# Patient Record
Sex: Female | Born: 1980 | Race: White | Hispanic: No | Marital: Married | Smoking: Current every day smoker
Health system: Southern US, Community
[De-identification: ages and names within clinical notes are randomized; demographics above are authoritative.]

## PROBLEM LIST (undated history)

## (undated) DIAGNOSIS — D649 Anemia, unspecified: Secondary | ICD-10-CM

## (undated) DIAGNOSIS — F32A Depression, unspecified: Secondary | ICD-10-CM

## (undated) DIAGNOSIS — F410 Panic disorder [episodic paroxysmal anxiety] without agoraphobia: Secondary | ICD-10-CM

## (undated) DIAGNOSIS — F319 Bipolar disorder, unspecified: Secondary | ICD-10-CM

## (undated) DIAGNOSIS — F329 Major depressive disorder, single episode, unspecified: Secondary | ICD-10-CM

## (undated) DIAGNOSIS — R569 Unspecified convulsions: Secondary | ICD-10-CM

## (undated) DIAGNOSIS — Z87442 Personal history of urinary calculi: Secondary | ICD-10-CM

## (undated) HISTORY — DX: Anemia, unspecified: D64.9

## (undated) HISTORY — PX: OTHER SURGICAL HISTORY: SHX169

---

## 2013-08-26 ENCOUNTER — Encounter (HOSPITAL_COMMUNITY): Payer: Self-pay | Admitting: Emergency Medicine

## 2013-08-26 ENCOUNTER — Emergency Department (HOSPITAL_COMMUNITY): Payer: MEDICAID

## 2013-08-26 ENCOUNTER — Emergency Department (HOSPITAL_COMMUNITY)
Admission: EM | Admit: 2013-08-26 | Discharge: 2013-08-26 | Disposition: A | Discharge status: Home / Self Care | Payer: MEDICAID | Attending: Emergency Medicine | Admitting: Emergency Medicine

## 2013-08-26 DIAGNOSIS — R112 Nausea with vomiting, unspecified: Secondary | ICD-10-CM | POA: Insufficient documentation

## 2013-08-26 DIAGNOSIS — R55 Syncope and collapse: Secondary | ICD-10-CM

## 2013-08-26 DIAGNOSIS — F1410 Cocaine abuse, uncomplicated: Secondary | ICD-10-CM | POA: Insufficient documentation

## 2013-08-26 DIAGNOSIS — F1910 Other psychoactive substance abuse, uncomplicated: Secondary | ICD-10-CM

## 2013-08-26 DIAGNOSIS — R0602 Shortness of breath: Secondary | ICD-10-CM | POA: Insufficient documentation

## 2013-08-26 DIAGNOSIS — F17200 Nicotine dependence, unspecified, uncomplicated: Secondary | ICD-10-CM | POA: Insufficient documentation

## 2013-08-26 DIAGNOSIS — Z79899 Other long term (current) drug therapy: Secondary | ICD-10-CM | POA: Insufficient documentation

## 2013-08-26 DIAGNOSIS — R51 Headache: Secondary | ICD-10-CM | POA: Insufficient documentation

## 2013-08-26 DIAGNOSIS — F419 Anxiety disorder, unspecified: Secondary | ICD-10-CM

## 2013-08-26 DIAGNOSIS — N390 Urinary tract infection, site not specified: Secondary | ICD-10-CM | POA: Insufficient documentation

## 2013-08-26 DIAGNOSIS — F319 Bipolar disorder, unspecified: Secondary | ICD-10-CM | POA: Insufficient documentation

## 2013-08-26 DIAGNOSIS — F4310 Post-traumatic stress disorder, unspecified: Secondary | ICD-10-CM | POA: Insufficient documentation

## 2013-08-26 DIAGNOSIS — R079 Chest pain, unspecified: Secondary | ICD-10-CM | POA: Insufficient documentation

## 2013-08-26 DIAGNOSIS — Z792 Long term (current) use of antibiotics: Secondary | ICD-10-CM | POA: Insufficient documentation

## 2013-08-26 DIAGNOSIS — F410 Panic disorder [episodic paroxysmal anxiety] without agoraphobia: Secondary | ICD-10-CM

## 2013-08-26 DIAGNOSIS — Z3202 Encounter for pregnancy test, result negative: Secondary | ICD-10-CM | POA: Insufficient documentation

## 2013-08-26 HISTORY — DX: Bipolar disorder, unspecified: F31.9

## 2013-08-26 HISTORY — DX: Panic disorder (episodic paroxysmal anxiety): F41.0

## 2013-08-26 HISTORY — DX: Major depressive disorder, single episode, unspecified: F32.9

## 2013-08-26 HISTORY — DX: Depression, unspecified: F32.A

## 2013-08-26 LAB — URINALYSIS, ROUTINE W REFLEX MICROSCOPIC
GLUCOSE, UA: NEGATIVE mg/dL
Hgb urine dipstick: NEGATIVE
Nitrite: NEGATIVE
PH: 6 (ref 5.0–8.0)
Specific Gravity, Urine: >1.03 — ABNORMAL HIGH (ref 1.005–1.030)
Urobilinogen, UA: 0.2 mg/dL (ref 0.0–1.0)

## 2013-08-26 LAB — CBC WITH DIFFERENTIAL/PLATELET
Basophils Absolute: 0 10*3/uL (ref 0.0–0.1)
Basophils Relative: 0 % (ref 0–1)
EOS ABS: 0 10*3/uL (ref 0.0–0.7)
Eosinophils Relative: 1 % (ref 0–5)
HCT: 38.8 % (ref 36.0–46.0)
HEMOGLOBIN: 13.5 g/dL (ref 12.0–15.0)
LYMPHS ABS: 2.1 10*3/uL (ref 0.7–4.0)
LYMPHS PCT: 34 % (ref 12–46)
MCH: 31.8 pg (ref 26.0–34.0)
MCHC: 34.8 g/dL (ref 30.0–36.0)
MCV: 91.5 fL (ref 78.0–100.0)
Monocytes Absolute: 0.5 10*3/uL (ref 0.1–1.0)
Monocytes Relative: 8 % (ref 3–12)
Neutro Abs: 3.5 10*3/uL (ref 1.7–7.7)
Neutrophils Relative %: 56 % (ref 43–77)
Platelets: 240 10*3/uL (ref 150–400)
RBC: 4.24 MIL/uL (ref 3.87–5.11)
RDW: 12.8 % (ref 11.5–15.5)
WBC: 6.2 10*3/uL (ref 4.0–10.5)

## 2013-08-26 LAB — RAPID URINE DRUG SCREEN, HOSP PERFORMED
Amphetamines: NOT DETECTED
Barbiturates: NOT DETECTED
Benzodiazepines: POSITIVE — AB
Cocaine: POSITIVE — AB
Opiates: NOT DETECTED
Tetrahydrocannabinol: NOT DETECTED

## 2013-08-26 LAB — BASIC METABOLIC PANEL
BUN: 8 mg/dL (ref 6–23)
CALCIUM: 9.3 mg/dL (ref 8.4–10.5)
CO2: 25 meq/L (ref 19–32)
Chloride: 102 mEq/L (ref 96–112)
Creatinine, Ser: 0.77 mg/dL (ref 0.50–1.10)
GFR calc Af Amer: >90 mL/min (ref >90)
GLUCOSE: 95 mg/dL (ref 70–99)
Potassium: 3.4 mEq/L — ABNORMAL LOW (ref 3.7–5.3)
Sodium: 140 mEq/L (ref 137–147)

## 2013-08-26 LAB — URINE MICROSCOPIC-ADD ON

## 2013-08-26 LAB — PREGNANCY, URINE: Preg Test, Ur: NEGATIVE

## 2013-08-26 MED ORDER — CIPROFLOXACIN HCL 250 MG PO TABS
500.0000 mg | ORAL_TABLET | Freq: Once | ORAL | Status: AC
  Administered 2013-08-26: 500 mg via ORAL
  Filled 2013-08-26: qty 2

## 2013-08-26 MED ORDER — SODIUM CHLORIDE 0.9 % IV SOLN: INTRAVENOUS | Status: DC

## 2013-08-26 MED ORDER — SODIUM CHLORIDE 0.9 % IV BOLUS (SEPSIS)
1000.0000 mL | Freq: Once | INTRAVENOUS | Status: AC
  Administered 2013-08-26: 1000 mL via INTRAVENOUS

## 2013-08-26 MED ORDER — CIPROFLOXACIN HCL 500 MG PO TABS: 500.0000 mg | ORAL_TABLET | Freq: Two times a day (BID) | ORAL | Status: DC

## 2013-08-26 NOTE — Discharge Instructions (Signed)
Taking a bike as directed for the urinary tract infection. Return for any newer worse symptoms. Rest of the workup here today was negative. Most likely had a panic attack with syncopal episode earlier. As she stated that no suicidal thoughts. Return for development of any suicidal thoughts.

## 2013-08-26 NOTE — ED Notes (Signed)
Patient asking about when she will get her regular medications.  Told her I would check w/MD.

## 2013-08-26 NOTE — ED Notes (Signed)
MD notified of patient's request for regular meds.  Also notified she has not yet given urine sample.

## 2013-08-26 NOTE — ED Notes (Signed)
Patient with no complaints at this time. Respirations even and unlabored. Skin warm/dry. Discharge instructions reviewed with patient at this time. Patient given opportunity to voice concerns/ask questions. IV removed per policy and band-aid applied to site. Patient discharged at this time and left Emergency Department with steady gait.  

## 2013-08-26 NOTE — ED Provider Notes (Signed)
CSN: [id number]     Arrival date & time 08/26/13  1653 History  This chart was scribed for Shelda Jakes, MD by Leone Payor, ED Scribe. This patient was seen in room APA17/[facility]17 and the patient's care was started 5:59 PM.    Chief Complaint  Patient presents with  . Anxiety  . Depression    The history is provided by the patient. No language interpreter was used.    HPI Comments: Kelli Mullins is a 33 y.o. female who presents to the Emergency Department complaining of a syncopal episode and chest pain that occurred a few hours ago. Pt was at [facility] when this happened because her children were taken away from her. She told people at [facility] that she would hurt herself if her children were taken from her. She believes she had a panic attack at that time. She currently denies SI and states she is just upset. She has a history of bipolar disease and PTSD. She reports taking Paxil and Cymbalta for this. LNMP was 3 months ago because she receives the Depo injection.   [facility]  Past Medical History  Diagnosis Date  . Bipolar 1 disorder   . Depression   . Panic attacks    History reviewed. No pertinent past surgical history. History reviewed. No pertinent family history. History  Substance Use Topics  . Smoking status: Current Every Day Smoker -- 1.00 packs/day    Types: Cigarettes  . Smokeless tobacco: Not on file  . Alcohol Use: No   OB History   Grav Para Term Preterm Abortions TAB SAB Ect Mult Living                 Review of Systems  Constitutional: Negative for fever and chills.  HENT: Negative for rhinorrhea and sore throat.   Eyes: Negative for visual disturbance.  Respiratory: Positive for shortness of breath. Negative for cough.   Cardiovascular: Positive for chest pain. Negative for leg swelling.  Gastrointestinal: Positive for nausea and vomiting. Negative for diarrhea.  Genitourinary: Negative for dysuria.  Musculoskeletal: Negative for  back pain and neck pain.  Skin: Negative for rash.  Neurological: Positive for syncope and headaches.  Hematological: Does not bruise/bleed easily.  Psychiatric/Behavioral: Negative for confusion.    Allergies  Review of patient's allergies indicates no known allergies.  Home Medications   Current Outpatient Rx  Name  Route  Sig  Dispense  Refill  . Diazepam (VALIUM PO)   Oral   Take 1 tablet by mouth 3 (three) times daily.         . DULoxetine HCl (CYMBALTA PO)   Oral   Take 1 capsule by mouth 2 (two) times daily.         Marland Kitchen PARoxetine HCl (PAXIL PO)   Oral   Take 1 tablet by mouth daily.         . ciprofloxacin (CIPRO) 500 MG tablet   Oral   Take 1 tablet (500 mg total) by mouth 2 (two) times daily.   14 tablet   0    BP 128/96  Pulse 88  Temp(Src) 98.1 F (36.7 C) (Oral)  Resp 18  Ht 5\' 6"  (1.676 m)  Wt 198 lb (89.812 kg)  BMI 31.97 kg/m2  SpO2 99%  LMP 06/22/2013 Physical Exam  Nursing note and vitals reviewed. Constitutional: She is oriented to person, place, and time. She appears well-developed and well-nourished.  HENT:  Head: Normocephalic and atraumatic.  Cardiovascular: Normal  rate, regular rhythm and normal heart sounds.   Pulmonary/Chest: Effort normal and breath sounds normal. No respiratory distress. She has no wheezes. She has no rales. She exhibits no tenderness.  Abdominal: Soft. Bowel sounds are normal. She exhibits no distension. There is no tenderness.  Neurological: She is alert and oriented to person, place, and time.  Skin: Skin is warm and dry.  Psychiatric: She has a normal mood and affect.    ED Course  Procedures (including critical care time)  DIAGNOSTIC STUDIES: Oxygen Saturation is 99% on RA, normal by my interpretation.    COORDINATION OF CARE: 6:05 PM Discussed treatment plan with pt at bedside and pt agreed to plan.   Labs Review Labs Reviewed  URINE RAPID DRUG SCREEN (HOSP PERFORMED) - Abnormal; Notable for the  following:    Cocaine POSITIVE (*)    Benzodiazepines POSITIVE (*)    All other components within normal limits  BASIC METABOLIC PANEL - Abnormal; Notable for the following:    Potassium 3.4 (*)    All other components within normal limits  URINALYSIS, ROUTINE W REFLEX MICROSCOPIC - Abnormal; Notable for the following:    APPearance CLOUDY (*)    Specific Gravity, Urine >1.030 (*)    Bilirubin Urine SMALL (*)    Ketones, ur TRACE (*)    Protein, ur TRACE (*)    Leukocytes, UA TRACE (*)    All other components within normal limits  URINE MICROSCOPIC-ADD ON - Abnormal; Notable for the following:    Bacteria, UA FEW (*)    All other components within normal limits  URINE CULTURE  CBC WITH DIFFERENTIAL  PREGNANCY, URINE   Results for orders placed during the hospital encounter of 08/26/13  URINE RAPID DRUG SCREEN (HOSP PERFORMED)      Result Value Range   Opiates NONE DETECTED  NONE DETECTED   Cocaine POSITIVE (*) NONE DETECTED   Benzodiazepines POSITIVE (*) NONE DETECTED   Amphetamines NONE DETECTED  NONE DETECTED   Tetrahydrocannabinol NONE DETECTED  NONE DETECTED   Barbiturates NONE DETECTED  NONE DETECTED  CBC WITH DIFFERENTIAL      Result Value Range   WBC 6.2  4.0 - 10.5 K/uL   RBC 4.24  3.87 - 5.11 MIL/uL   Hemoglobin 13.5  12.0 - 15.0 g/dL   HCT 16.1  09.6 - 04.5 %   MCV 91.5  78.0 - 100.0 fL   MCH 31.8  26.0 - 34.0 pg   MCHC 34.8  30.0 - 36.0 g/dL   RDW 40.9  81.1 - 91.4 %   Platelets 240  150 - 400 K/uL   Neutrophils Relative % 56  43 - 77 %   Neutro Abs 3.5  1.7 - 7.7 K/uL   Lymphocytes Relative 34  12 - 46 %   Lymphs Abs 2.1  0.7 - 4.0 K/uL   Monocytes Relative 8  3 - 12 %   Monocytes Absolute 0.5  0.1 - 1.0 K/uL   Eosinophils Relative 1  0 - 5 %   Eosinophils Absolute 0.0  0.0 - 0.7 K/uL   Basophils Relative 0  0 - 1 %   Basophils Absolute 0.0  0.0 - 0.1 K/uL  BASIC METABOLIC PANEL      Result Value Range   Sodium 140  137 - 147 mEq/L   Potassium 3.4  (*) 3.7 - 5.3 mEq/L   Chloride 102  96 - 112 mEq/L   CO2 25  19 - 32 mEq/L  Glucose, Bld 95  70 - 99 mg/dL   BUN 8  6 - 23 mg/dL   Creatinine, Ser 1.61  0.50 - 1.10 mg/dL   Calcium 9.3  8.4 - 09.6 mg/dL   GFR calc non Af Amer >90  >90 mL/min   GFR calc Af Amer >90  >90 mL/min  PREGNANCY, URINE      Result Value Range   Preg Test, Ur NEGATIVE  NEGATIVE  URINALYSIS, ROUTINE W REFLEX MICROSCOPIC      Result Value Range   Color, Urine YELLOW  YELLOW   APPearance CLOUDY (*) CLEAR   Specific Gravity, Urine >1.030 (*) 1.005 - 1.030   pH 6.0  5.0 - 8.0   Glucose, UA NEGATIVE  NEGATIVE mg/dL   Hgb urine dipstick NEGATIVE  NEGATIVE   Bilirubin Urine SMALL (*) NEGATIVE   Ketones, ur TRACE (*) NEGATIVE mg/dL   Protein, ur TRACE (*) NEGATIVE mg/dL   Urobilinogen, UA 0.2  0.0 - 1.0 mg/dL   Nitrite NEGATIVE  NEGATIVE   Leukocytes, UA TRACE (*) NEGATIVE  URINE MICROSCOPIC-ADD ON      Result Value Range   WBC, UA 21-50  <3 WBC/hpf   Bacteria, UA FEW (*) RARE   Urine-Other MUCOUS PRESENT      Imaging Review Dg Chest 2 View  08/26/2013   CLINICAL DATA:  Weakness and lethargy.  EXAM: CHEST  2 VIEW  COMPARISON:  None.  FINDINGS: Normal sized heart. Clear lungs. Bilateral nipple shadows. Minimal thoracic spine degenerative changes.  IMPRESSION: No acute abnormality.   Electronically Signed   By: Gordan Payment M.D.   On: 08/26/2013 21:52   Ct Head Wo Contrast  08/26/2013   CLINICAL DATA:  Anxiety, headache, depression.  EXAM: CT HEAD WITHOUT CONTRAST  TECHNIQUE: Contiguous axial images were obtained from the base of the skull through the vertex without intravenous contrast.  COMPARISON:  None.  FINDINGS: No acute intracranial abnormality. Specifically, no hemorrhage, hydrocephalus, mass lesion, acute infarction, or significant intracranial injury. No acute calvarial abnormality. Visualized paranasal sinuses and mastoids clear. Orbital soft tissues unremarkable.  IMPRESSION: Negative study.    Electronically Signed   By: Charlett Nose M.D.   On: 08/26/2013 21:39    EKG Interpretation   None      Date: 08/26/2013  Rate: 61  Rhythm: normal sinus rhythm  QRS Axis: normal  Intervals: normal  ST/T Wave abnormalities: normal  Conduction Disutrbances:none  Narrative Interpretation:   Old EKG Reviewed: none available    MDM   1. Syncope   2. Panic attack   3. Anxiety   4. UTI (lower urinary tract infection)   5. Substance abuse    Workup negative with the exception of a urinary tract infection. Patient treated for stenosis Cipro here. Urine drug screen consistent with substance abuse. Events earlier in the day sound as if it was a panic attack with a vasovagal syncopal episode. Workup for that has been negative. EKG is normal chest x-ray negative head CT negative no anemia no leukocytosis. Pregnancy test is negative. Chest partner requested to be notified if she was discharged home. They will be notified.  Patient has repeatedly denied suicidal plans are distinct suicidal thoughts here. Patient denies any feelings of suicide is concerned about being alone but not from a suicidal standpoint. Patient will be discharged home. Patient states that she will return for any development of any suicidal thoughts.  I personally performed the services described in this documentation, which was scribed  in my presence. The recorded information has been reviewed and is accurate.     Shelda JakesScott W. Remigio Mcmillon, MD 08/26/13 2203

## 2013-08-26 NOTE — ED Notes (Signed)
Per EMS, called out from [facility]. Pt reports panic attack that started today. Pt alert and oriented. Pt reports " i dont want to go home since my kids won't be home." Pt denies SI/HI at this time but reports " i dont want to go home because I don't know what I might do to myself."

## 2013-08-28 LAB — URINE CULTURE
COLONY COUNT: NO GROWTH
Culture: NO GROWTH

## 2014-09-18 ENCOUNTER — Telehealth: Payer: Self-pay | Admitting: *Deleted

## 2014-09-18 NOTE — Telephone Encounter (Signed)
Pt states had 2 periods this month, now c/o heavy vaginal bleeding, dizziness. Pt states she has a HX of anemia. Pt advised to go to ER for evaluation.  Pt verbalized understanding.

## 2015-05-25 IMAGING — CT CT HEAD W/O CM
1 series · 16 of 30 positions shown, 20 images · non-contrast
Comparison: None.

CLINICAL DATA: Anxiety, headache, depression.

EXAM:
CT HEAD WITHOUT CONTRAST
TECHNIQUE: Contiguous axial images were obtained from the base of the skull
through the vertex without intravenous contrast.

[Series 2: headseq 4.8 h37s · axial · 0.43mm/px · z∈[+962,+1120]mm · 16 of 36 slices shown, 20 images]
[im 2/36  brain]
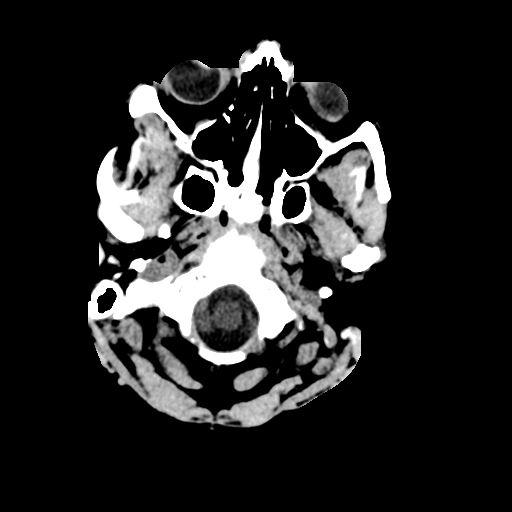
[im 2/36  bone]
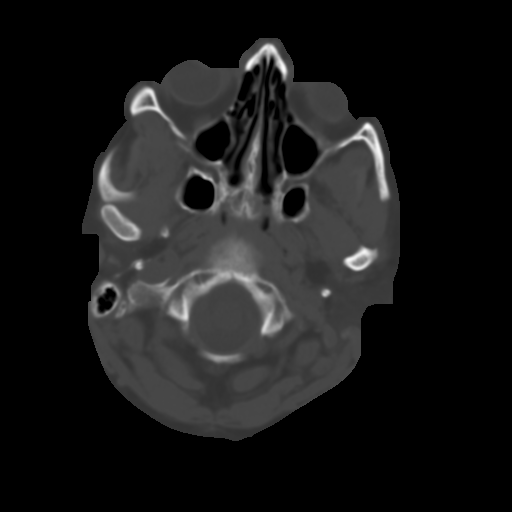
[im 4/36  brain]
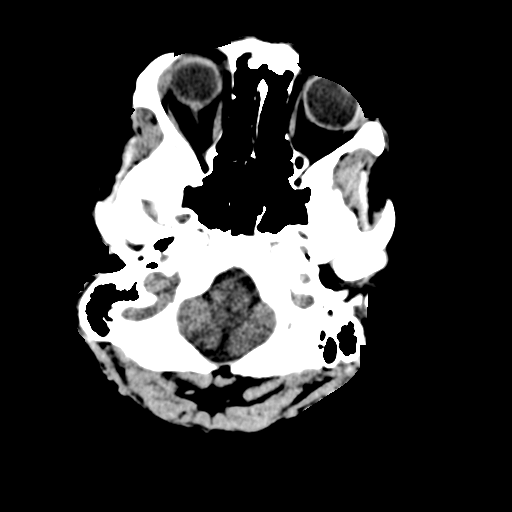
[im 7/36  brain]
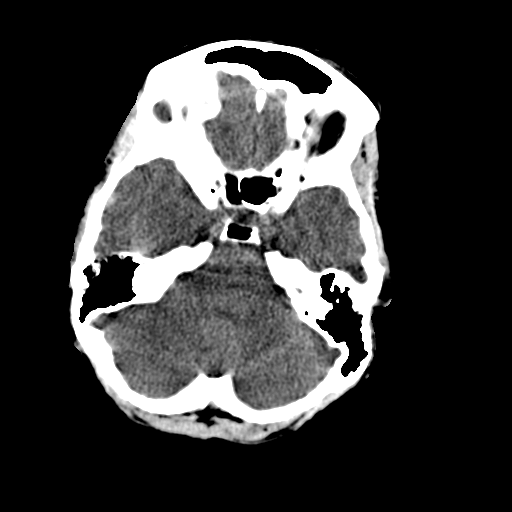
[im 9/36  brain]
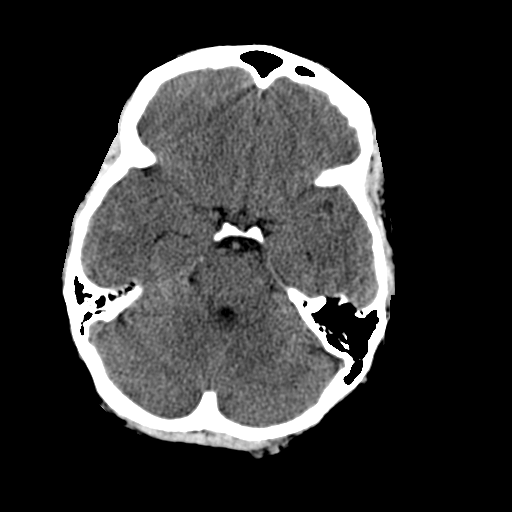
[im 10/36  brain]
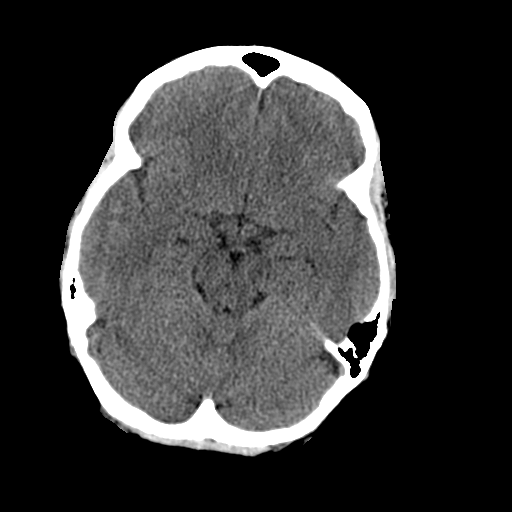
[im 10/36  bone]
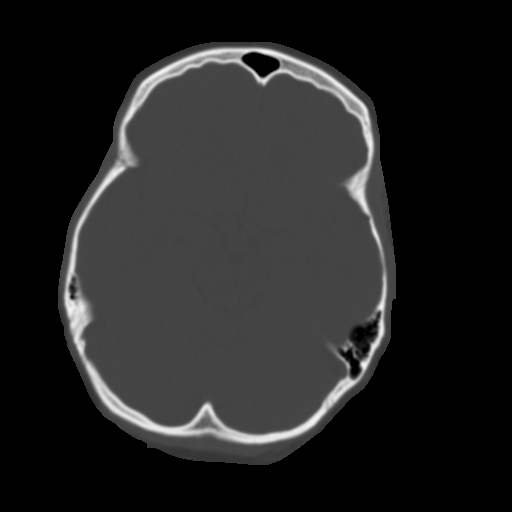
[im 13/36  brain]
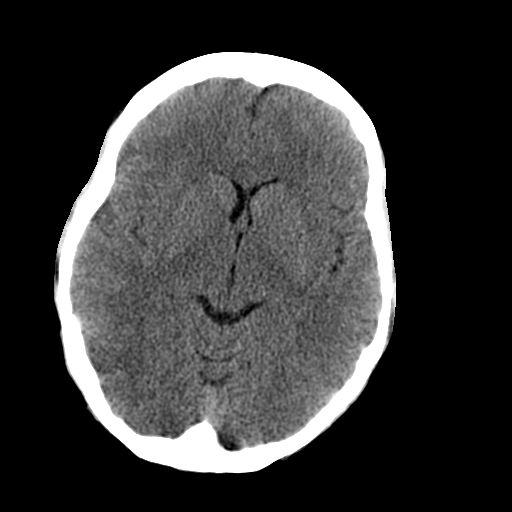
[im 15/36  brain]
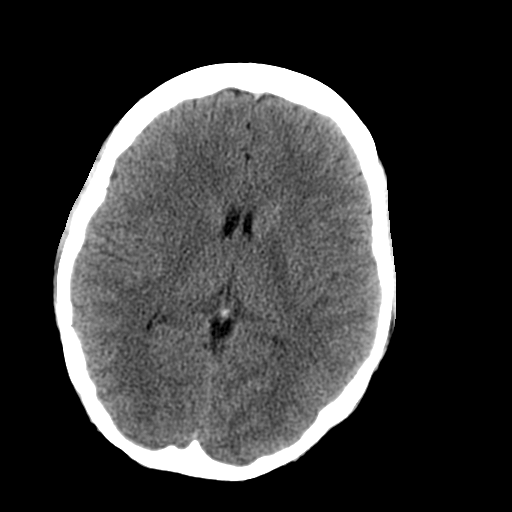
[im 17/36  brain]
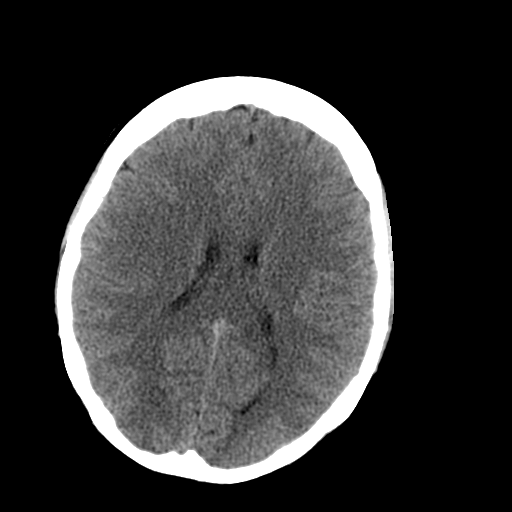
[im 19/36  brain]
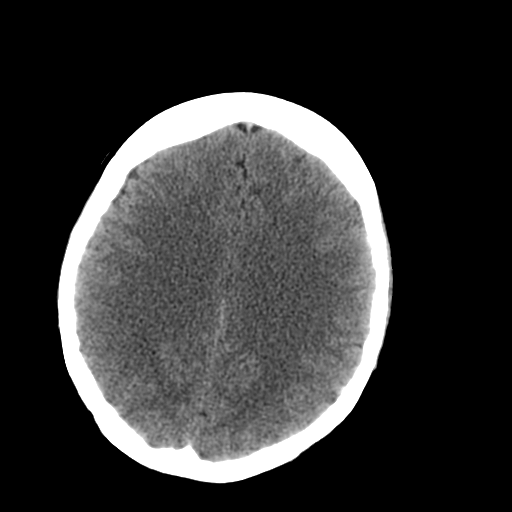
[im 19/36  bone]
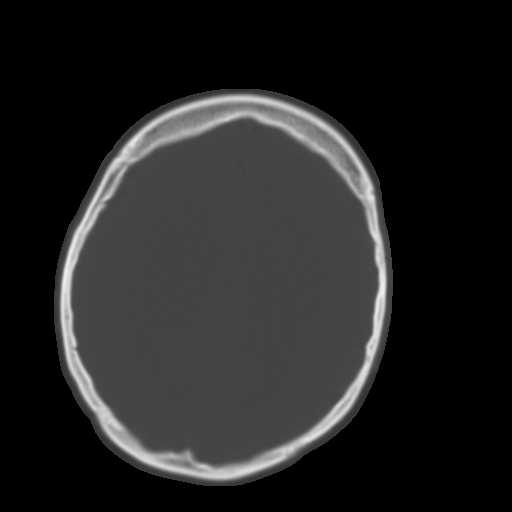
[im 21/36  brain]
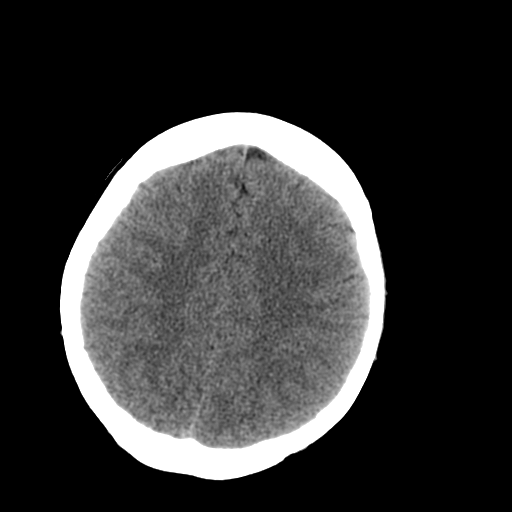
[im 23/36  brain]
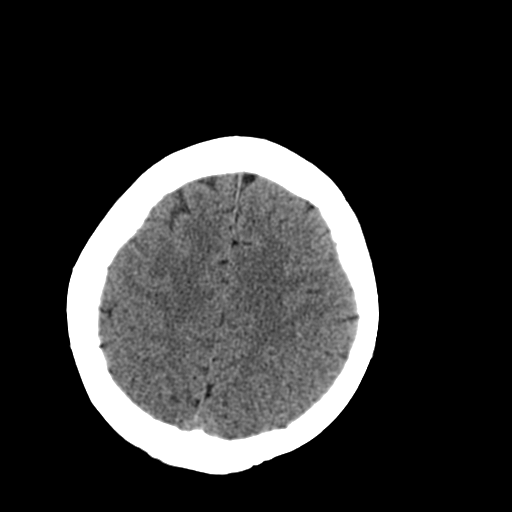
[im 26/36  brain]
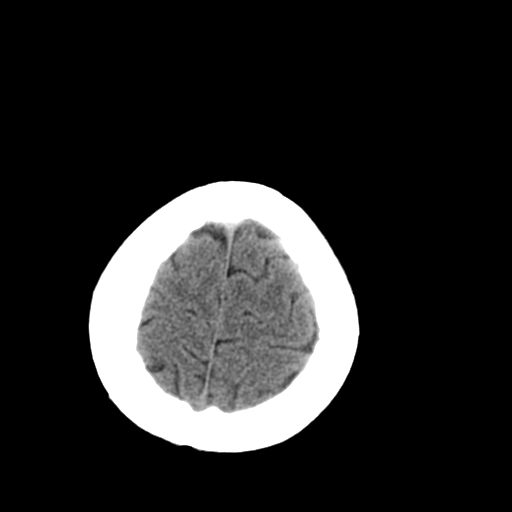
[im 27/36  brain]
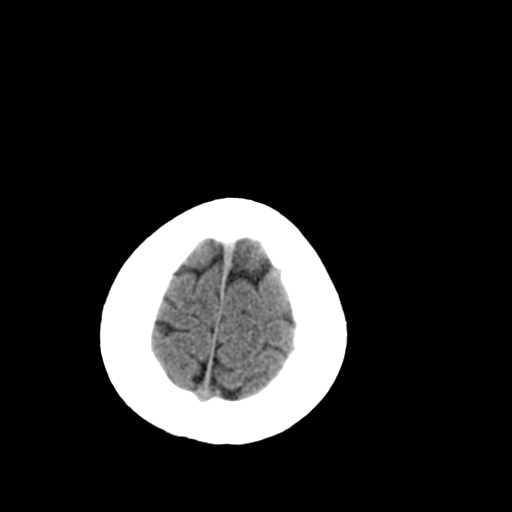
[im 27/36  bone]
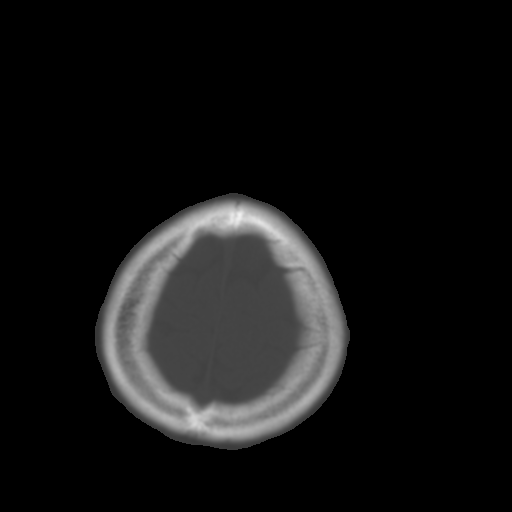
[im 29/36  brain]
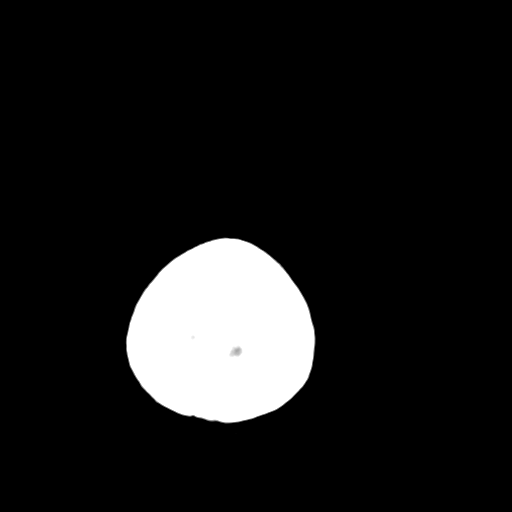
[im 32/36  brain]
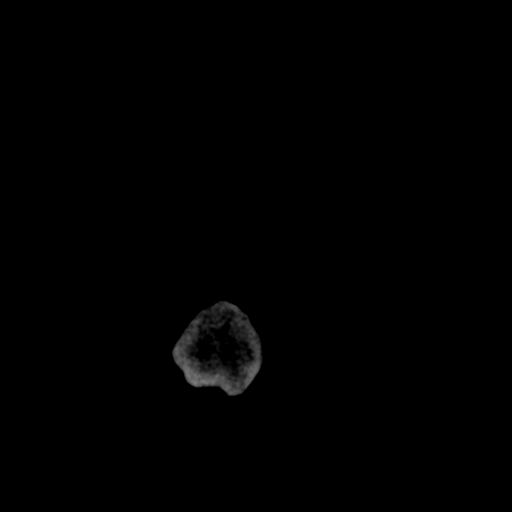
[im 34/36  brain]
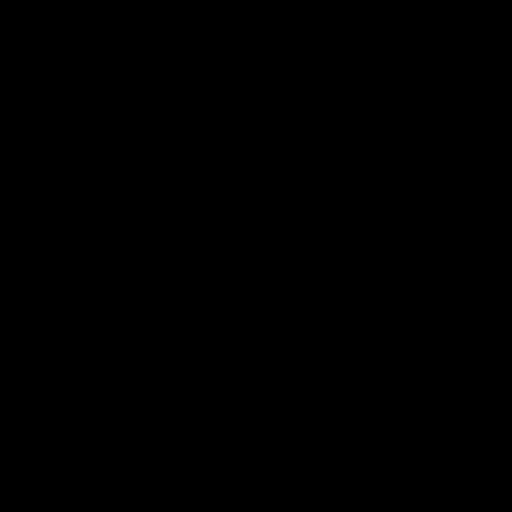

[16 of 30 positions shown; findings below may reference images not displayed]

FINDINGS: No acute intracranial abnormality. Specifically, no hemorrhage,
hydrocephalus, mass lesion, acute infarction, or significant
intracranial injury. No acute calvarial abnormality. Visualized
paranasal sinuses and mastoids clear. Orbital soft tissues
unremarkable.
IMPRESSION: Negative study.

## 2016-06-25 ENCOUNTER — Emergency Department (HOSPITAL_COMMUNITY)
Admission: EM | Admit: 2016-06-25 | Discharge: 2016-06-25 | Disposition: A | Discharge status: Home / Self Care | Payer: Medicaid Other | Attending: Emergency Medicine | Admitting: Emergency Medicine

## 2016-06-25 ENCOUNTER — Encounter (HOSPITAL_COMMUNITY): Payer: Self-pay | Admitting: Emergency Medicine

## 2016-06-25 DIAGNOSIS — F17210 Nicotine dependence, cigarettes, uncomplicated: Secondary | ICD-10-CM | POA: Insufficient documentation

## 2016-06-25 DIAGNOSIS — N760 Acute vaginitis: Secondary | ICD-10-CM | POA: Insufficient documentation

## 2016-06-25 DIAGNOSIS — B9689 Other specified bacterial agents as the cause of diseases classified elsewhere: Secondary | ICD-10-CM

## 2016-06-25 DIAGNOSIS — Z202 Contact with and (suspected) exposure to infections with a predominantly sexual mode of transmission: Secondary | ICD-10-CM | POA: Insufficient documentation

## 2016-06-25 LAB — WET PREP, GENITAL
Sperm: NONE SEEN
TRICH WET PREP: NONE SEEN
Yeast Wet Prep HPF POC: NONE SEEN

## 2016-06-25 LAB — RAPID HIV SCREEN (HIV 1/2 AB+AG)
HIV 1/2 Antibodies: NONREACTIVE
HIV-1 P24 Antigen - HIV24: NONREACTIVE

## 2016-06-25 MED ORDER — PENICILLIN G BENZATHINE 1200000 UNIT/2ML IM SUSP
1.2000 10*6.[IU] | Freq: Once | INTRAMUSCULAR | Status: AC
  Administered 2016-06-25: 1.2 10*6.[IU] via INTRAMUSCULAR
  Filled 2016-06-25: qty 2

## 2016-06-25 MED ORDER — METRONIDAZOLE 500 MG PO TABS: 500.0000 mg | ORAL_TABLET | Freq: Two times a day (BID) | ORAL | 0 refills | Status: DC

## 2016-06-25 NOTE — ED Triage Notes (Signed)
Pt is here because she was exposed to STD from husband.  Denies any symptoms.

## 2016-06-25 NOTE — ED Notes (Signed)
Pt states here because of STD exposure, pt denies any pain but has had some mucus discharge.

## 2016-06-25 NOTE — ED Notes (Signed)
Pt alert & oriented x4, stable gait. Patient given discharge instructions, paperwork & prescription(s). Patient  instructed to stop at the registration desk to finish any additional paperwork. Patient verbalized understanding. Pt left department w/ no further questions. 

## 2016-06-25 NOTE — Discharge Instructions (Signed)
As discussed,  we recommend 2 more doses of the antibiotic for 2 additional weeks as it is unclear how long ago you were exposed to syphilis.  You may see your doctor or go to the health department for your next doses.  Return here if you are unable to get your treatments at either of the those sources.

## 2016-06-26 LAB — HIV ANTIBODY (ROUTINE TESTING W REFLEX): HIV Screen 4th Generation wRfx: NONREACTIVE

## 2016-06-27 LAB — GC/CHLAMYDIA PROBE AMP (~~LOC~~) NOT AT ARMC
Chlamydia: NEGATIVE
NEISSERIA GONORRHEA: NEGATIVE

## 2016-06-27 NOTE — ED Provider Notes (Signed)
[facility]-EMERGENCY DEPT Provider Note   CSN: [id number] Arrival date & time: 06/25/16  1238     History   Chief Complaint Chief Complaint  Patient presents with  . Exposure to STD    HPI Kelli Mullins is a 35 y.o. 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  She presents with her new husband who was just notified by our department that he tested positive for syphilis.  Patient denies any history of an unexplained rash, painless ulcers or genital lesions but does endorse she has noticed increased vaginal discharge for the past month.  She denies fevers, chills, abdominal pain, nausea or vomiting.  They state that they were separated until 4 months ago when they got back together, in fact just got married today.  She is desirous to be tested for all stds in addition to treatment for syphilis.  It is unclear how long her husband may have had this infection.  He states his last partner before her was more than 4 months ago.  The history is provided by the patient and the spouse.    Past Medical History:  Diagnosis Date  . Bipolar 1 disorder (HCC)   . Depression   . Panic attacks     There are no active problems to display for this patient.   No past surgical history on file.  OB History    No data available       Home Medications    Prior to Admission medications   Medication Sig Start Date End Date Taking? Authorizing Provider  Multiple Vitamin (MULTIVITAMIN) tablet Take 1 tablet by mouth daily.   Yes Historical Provider, MD  metroNIDAZOLE (FLAGYL) 500 MG tablet Take 1 tablet (500 mg total) by mouth 2 (two) times daily. 06/25/16   Burgess AmorJulie Adriahna Shearman, PA-C    Family History No family history on file.  Social History Social History  Substance Use Topics  . Smoking status: Current Every Day Smoker    Packs/day: 1.00    Types: Cigarettes  . Smokeless tobacco: Never Used  . Alcohol use No     Allergies   Patient has no known  allergies.   Review of Systems Review of Systems  Constitutional: Negative for chills and fever.  HENT: Negative for congestion and sore throat.   Eyes: Negative.   Respiratory: Negative for chest tightness and shortness of breath.   Cardiovascular: Negative for chest pain.  Gastrointestinal: Negative for abdominal pain and nausea.  Genitourinary: Positive for vaginal discharge. Negative for genital sores.  Musculoskeletal: Negative for arthralgias, joint swelling and neck pain.  Skin: Negative.  Negative for rash and wound.  Neurological: Negative for dizziness, weakness, light-headedness, numbness and headaches.  Psychiatric/Behavioral: Negative.      Physical Exam Updated Vital Signs BP 125/71 (BP Location: Left Arm)   Pulse (!) 55   Temp 97.7 F (36.5 C) (Oral)   Resp 16   Ht 5\' 7"  (1.702 m)   Wt 77.1 kg   LMP 06/18/2016   SpO2 100%   BMI 26.63 kg/m   Physical Exam  Constitutional: She appears well-developed and well-nourished.  HENT:  Head: Normocephalic and atraumatic.  Eyes: Conjunctivae are normal.  Neck: Normal range of motion.  Cardiovascular: Normal rate, regular rhythm, normal heart sounds and intact distal pulses.   Pulmonary/Chest: Effort normal and breath sounds normal. She has no wheezes.  Abdominal: Soft. Bowel sounds are normal. There is no tenderness.  Genitourinary: There is no lesion on the  right labia. There is no lesion on the left labia. Cervix exhibits no motion tenderness, no discharge and no friability. Right adnexum displays no mass, no tenderness and no fullness. Left adnexum displays no mass, no tenderness and no fullness. No erythema or tenderness in the vagina. Vaginal discharge found.  Genitourinary Comments: Thick, clear, mucoid discharge.  Musculoskeletal: Normal range of motion.  Neurological: She is alert.  Skin: Skin is warm and dry.  Psychiatric: She has a normal mood and affect.  Nursing note and vitals reviewed.    ED  Treatments / Results  Labs (all labs ordered are listed, but only abnormal results are displayed) Labs Reviewed  WET PREP, GENITAL - Abnormal; Notable for the following:       Result Value   Clue Cells Wet Prep HPF POC PRESENT (*)    WBC, Wet Prep HPF POC PRESENT (*)    All other components within normal limits  HIV ANTIBODY (ROUTINE TESTING)  RAPID HIV SCREEN (HIV 1/2 AB+AG)  GC/CHLAMYDIA PROBE AMP ([facility]    EKG  EKG Interpretation None       Radiology No results found.  Procedures Procedures (including critical care time)  Medications Ordered in ED Medications  penicillin g benzathine (BICILLIN LA) 1200000 UNIT/2ML injection 1.2 Million Units (1.2 Million Units Intramuscular Given 06/25/16 1437)  penicillin g benzathine (BICILLIN LA) 1200000 UNIT/2ML injection 1.2 Million Units (1.2 Million Units Intramuscular Given 06/25/16 1508)     Initial Impression / Assessment and Plan / ED Course  I have reviewed the triage vital signs and the nursing notes.  Pertinent labs & imaging results that were available during my care of the patient were reviewed by me and considered in my medical decision making (see chart for details).  Clinical Course     Pt tx with bicillin 2.4 mil units.  Prescribed flagyl for bv.  She was advised 2 more doses weekly since the exposure timeline is unclear.  Cx pending, HIV and RPR collected.  Pcp or health dept for further tx returning here if unable to get treated by these other sources.  Final Clinical Impressions(s) / ED Diagnoses   Final diagnoses:  Exposure to syphilis  Bacterial vaginosis    New Prescriptions Discharge Medication List as of 06/25/2016  3:40 PM       Burgess AmorJulie Abdulkarim Eberlin, PA-C 06/27/16 1456    Marily MemosJason Mesner, MD 07/02/16 [phone]

## 2016-12-11 ENCOUNTER — Emergency Department (HOSPITAL_COMMUNITY)
Admission: EM | Admit: 2016-12-11 | Discharge: 2016-12-11 | Disposition: A | Discharge status: Home / Self Care | Payer: Self-pay | Attending: Emergency Medicine | Admitting: Emergency Medicine

## 2016-12-11 ENCOUNTER — Encounter (HOSPITAL_COMMUNITY): Payer: Self-pay

## 2016-12-11 DIAGNOSIS — L03114 Cellulitis of left upper limb: Secondary | ICD-10-CM | POA: Insufficient documentation

## 2016-12-11 DIAGNOSIS — F17210 Nicotine dependence, cigarettes, uncomplicated: Secondary | ICD-10-CM | POA: Insufficient documentation

## 2016-12-11 MED ORDER — DOXYCYCLINE HYCLATE 100 MG PO CAPS: 100.0000 mg | ORAL_CAPSULE | Freq: Two times a day (BID) | ORAL | 0 refills | Status: AC

## 2016-12-11 MED ORDER — DOXYCYCLINE HYCLATE 100 MG PO TABS
100.0000 mg | ORAL_TABLET | Freq: Once | ORAL | Status: AC
  Administered 2016-12-11: 100 mg via ORAL
  Filled 2016-12-11: qty 1

## 2016-12-11 MED ORDER — IBUPROFEN 600 MG PO TABS: 600.0000 mg | ORAL_TABLET | Freq: Four times a day (QID) | ORAL | 0 refills | Status: DC | PRN

## 2016-12-11 MED ORDER — IBUPROFEN 400 MG PO TABS
600.0000 mg | ORAL_TABLET | Freq: Once | ORAL | Status: AC
  Administered 2016-12-11: 600 mg via ORAL
  Filled 2016-12-11: qty 1

## 2016-12-11 NOTE — ED Triage Notes (Signed)
Pt has red, cellulitic to left posterior arm/ elbow x 3 days. PT reports clear drainage. No fevers/chills

## 2016-12-11 NOTE — Discharge Instructions (Signed)
Take your antibiotics as prescribed until completed. I also recommend taking ibuprofen and applying ice to affected area for 15-20 minutes 3-4 times daily for additional relief. Return to the emergency department in 2 days for wound recheck. Return to the emergency department sooner if symptoms worsen or new onset of fever, numbness, weakness, decreased range of motion, vomiting, spreading of redness, red streaking, drainage.

## 2016-12-11 NOTE — ED Provider Notes (Signed)
[facility]-EMERGENCY DEPT Provider Note    By signing my name below, I, Earmon Phoenix, attest that this documentation has been prepared under the direction and in the presence of Melburn Hake, PA-C. Electronically Signed: Earmon Phoenix, ED Scribe. 12/11/16. 3:54 PM.    History   Chief Complaint No chief complaint on file.  The history is provided by the patient and medical records. No language interpreter was used.    Kelli Mullins is an obese 36 y.o. female with PMHx of bipolar disorder, depression and panic attacks who presents to the Emergency Department complaining of worsening posterior left elbow redness and swelling that began three days ago. She reports associated severe pain and clear drainage. She states the area began as a small pimple like lesion but has been worsening since. She reports associated tingling of the fingers tips of the left hand. She has not taken anything for pain. Moving the elbow or touching the area increases the pain. She denies alleviating factors. She denies fever, chills, nausea, vomiting, numbness, tingling or weakness of the LUE. She denies any trauma, injury or fall. Denies IVDU. She is left hand dominant. She states she has an appt with her PCP in five days but could not wait. She denies allergies to any medications.    Past Medical History:  Diagnosis Date  . Bipolar 1 disorder (HCC)   . Depression   . Panic attacks     There are no active problems to display for this patient.   History reviewed. No pertinent surgical history.  OB History    No data available       Home Medications    Prior to Admission medications   Medication Sig Start Date End Date Taking? Authorizing Provider  doxycycline (VIBRAMYCIN) 100 MG capsule Take 1 capsule (100 mg total) by mouth 2 (two) times daily. 12/11/16 12/18/16  Barrett Henle, PA-C  ibuprofen (ADVIL,MOTRIN) 600 MG tablet Take 1 tablet (600 mg total) by mouth every 6 (six) hours as needed.  12/11/16   Barrett Henle, PA-C  metroNIDAZOLE (FLAGYL) 500 MG tablet Take 1 tablet (500 mg total) by mouth 2 (two) times daily. 06/25/16   Burgess Amor, PA-C  Multiple Vitamin (MULTIVITAMIN) tablet Take 1 tablet by mouth daily.    [provider]    Family History No family history on file.  Social History Social History  Substance Use Topics  . Smoking status: Current Every Day Smoker    Packs/day: 1.00    Types: Cigarettes  . Smokeless tobacco: Never Used  . Alcohol use No     Allergies   Patient has no known allergies.   Review of Systems Review of Systems  Constitutional: Negative for chills and fever.  Gastrointestinal: Negative for nausea and vomiting.  Musculoskeletal: Positive for joint swelling.  Skin: Positive for color change.  Neurological: Negative for weakness and numbness.     Physical Exam Updated Vital Signs BP 111/79 (BP Location: Left Arm)   Pulse 94   Temp 97.7 F (36.5 C) (Oral)   Resp 16   Ht 5\' 7"  (1.702 m)   Wt 190 lb (86.2 kg)   LMP 11/14/2016   SpO2 98%   BMI 29.76 kg/m   Physical Exam  Constitutional: She is oriented to person, place, and time. She appears well-developed and well-nourished.  HENT:  Head: Normocephalic and atraumatic.  Eyes: Conjunctivae and EOM are normal. Right eye exhibits no discharge. Left eye exhibits no discharge. No scleral icterus.  Neck: Normal  range of motion. Neck supple.  Cardiovascular: Normal rate and intact distal pulses.   Pulmonary/Chest: Effort normal.  Musculoskeletal: Normal range of motion. She exhibits tenderness. She exhibits no edema or deformity.  Full ROM of left hand, wrist, forearm, elbow and shoulder with 5/5 strength. No joint effusion present to left elbow. No bony tenderness. Radial pulse 2+. Sensation grossly intact. Cap refill less than 2 seconds.  Neurological: She is alert and oriented to person, place, and time.  Skin: Skin is warm and dry. Capillary refill takes  less than 2 seconds. There is erythema.  9 x 10 cm area of erythema, warmth and swelling present to posterior aspect of left proximal forearm. Small, papule present to left posterior elbow without induration, fluctuance or drainage.  Nursing note and vitals reviewed.    ED Treatments / Results  DIAGNOSTIC STUDIES: Oxygen Saturation is 98% on RA, normal by my interpretation.   COORDINATION OF CARE: 3:43 PM- Will mark the area of redness and prescribe antibiotics. Return precautions discussed. Instructed pt to return for recheck in two days if symptoms continue or worsen. Pt verbalizes understanding and agrees to plan.  Medications  doxycycline (VIBRA-TABS) tablet 100 mg (not administered)  ibuprofen (ADVIL,MOTRIN) tablet 600 mg (not administered)    Labs (all labs ordered are listed, but only abnormal results are displayed) Labs Reviewed - No data to display  EKG  EKG Interpretation None       Radiology No results found.  Procedures Procedures (including critical care time)  Medications Ordered in ED Medications  doxycycline (VIBRA-TABS) tablet 100 mg (not administered)  ibuprofen (ADVIL,MOTRIN) tablet 600 mg (not administered)     Initial Impression / Assessment and Plan / ED Course  I have reviewed the triage vital signs and the nursing notes.  Pertinent labs & imaging results that were available during my care of the patient were reviewed by me and considered in my medical decision making (see chart for details).     Patient presentation consistent with cellulitis. Afebrile. No tachycardia, hypotension or other symptoms suggestive of severe infection. Area has been demarcated and pt advised to follow up for wound check in 2-3 days, sooner for worsening systemic symptoms, new lymphangitis, or significant spread of erythema past line of demarcation. Will discharge with Doxycycline. Return precautions discussed. Pt appears safe for discharge.    Final Clinical  Impressions(s) / ED Diagnoses   Final diagnoses:  Cellulitis of left upper extremity    New Prescriptions New Prescriptions   DOXYCYCLINE (VIBRAMYCIN) 100 MG CAPSULE    Take 1 capsule (100 mg total) by mouth 2 (two) times daily.   IBUPROFEN (ADVIL,MOTRIN) 600 MG TABLET    Take 1 tablet (600 mg total) by mouth every 6 (six) hours as needed.    I personally performed the services described in this documentation, which was scribed in my presence. The recorded information has been reviewed and is accurate.    Barrett Henleadeau, Fariha Goto Elizabeth, PA-C 12/11/16 1554    Melene PlanFloyd, Dan, DO 12/11/16 1606

## 2017-07-21 DIAGNOSIS — R569 Unspecified convulsions: Secondary | ICD-10-CM

## 2017-07-21 HISTORY — DX: Unspecified convulsions: R56.9

## 2018-08-19 ENCOUNTER — Ambulatory Visit (INDEPENDENT_AMBULATORY_CARE_PROVIDER_SITE_OTHER): Payer: Self-pay | Admitting: Adult Health

## 2018-08-19 ENCOUNTER — Encounter: Payer: Self-pay | Admitting: Adult Health

## 2018-08-19 VITALS — BP 123/83 | HR 71 | Ht 65.0 in | Wt 190.0 lb

## 2018-08-19 DIAGNOSIS — O219 Vomiting of pregnancy, unspecified: Secondary | ICD-10-CM | POA: Insufficient documentation

## 2018-08-19 DIAGNOSIS — Z3201 Encounter for pregnancy test, result positive: Secondary | ICD-10-CM

## 2018-08-19 DIAGNOSIS — O3680X0 Pregnancy with inconclusive fetal viability, not applicable or unspecified: Secondary | ICD-10-CM | POA: Insufficient documentation

## 2018-08-19 DIAGNOSIS — O09521 Supervision of elderly multigravida, first trimester: Secondary | ICD-10-CM | POA: Insufficient documentation

## 2018-08-19 DIAGNOSIS — Z3A09 9 weeks gestation of pregnancy: Secondary | ICD-10-CM | POA: Insufficient documentation

## 2018-08-19 DIAGNOSIS — R112 Nausea with vomiting, unspecified: Secondary | ICD-10-CM

## 2018-08-19 DIAGNOSIS — F1991 Other psychoactive substance use, unspecified, in remission: Secondary | ICD-10-CM | POA: Insufficient documentation

## 2018-08-19 DIAGNOSIS — N926 Irregular menstruation, unspecified: Secondary | ICD-10-CM

## 2018-08-19 DIAGNOSIS — Z87898 Personal history of other specified conditions: Secondary | ICD-10-CM

## 2018-08-19 LAB — POCT URINE PREGNANCY: Preg Test, Ur: POSITIVE — AB

## 2018-08-19 MED ORDER — PRENATAL PLUS 27-1 MG PO TABS: 1.0000 | ORAL_TABLET | Freq: Every day | ORAL | 12 refills | Status: DC

## 2018-08-19 MED ORDER — PROMETHAZINE HCL 25 MG PO TABS: 25.0000 mg | ORAL_TABLET | Freq: Four times a day (QID) | ORAL | 1 refills | Status: DC | PRN

## 2018-08-19 NOTE — Progress Notes (Signed)
Patient ID: Kelli Mullins, female   DOB: [birth year], 38 y.o.   MRN: [id number] History of Present Illness:  Kelli Mullins is a 38 year old white female, recently married(he is 44), in for UPT. Has missed periods and had 3+HPTs.  PCP is Dr Rayfield Citizen.   Current Medications, Allergies, Past Medical History, Past Surgical History, Family History and Social History were reviewed in [employer] record.     Review of Systems:  Periods irregular, but missed periods and had 3+HPTs +nasuea and vomiting She has used cocaine in last week   Physical Exam:BP 123/83 (BP Location: Left Arm, Patient Position: Sitting, Cuff Size: Normal)   Pulse 71   Ht 5\' 5"  (1.651 m)   Wt 190 lb (86.2 kg)   LMP 06/15/2018   BMI 31.62 kg/m   UPT +, about 9+2 weeks by LMP with EDD 03/23/2019. General:  Well developed, well nourished, no acute distress Skin:  Warm and dry,+tattoos Neck:  Midline trachea, normal thyroid, good ROM, no lymphadenopathy Lungs; Clear to auscultation bilaterally Cardiovascular: Regular rate and rhythm Abdomen:  Soft, non tender Psych:  No mood changes, alert and cooperative,seems happy Fall risk is low PHQ 2 score 0.  Impression:  1. Pregnancy examination or test, positive result   2. [redacted] weeks gestation of pregnancy   3. Nausea and vomiting during pregnancy prior to [redacted] weeks gestation   4. Multigravida of advanced maternal age in first trimester   5. Encounter to determine fetal viability of pregnancy, single or unspecified fetus   6. History of drug use      Plan: No more cocaine Meds ordered this encounter  Medications  . prenatal vitamin w/FE, FA (PRENATAL 1 + 1) 27-1 MG TABS tablet    Sig: Take 1 tablet by mouth daily at 12 noon.    Dispense:  30 each    Refill:  12    Order Specific Question:   Supervising Provider    Answer:   Despina Hidden, LUTHER H [[id number]]  . promethazine (PHENERGAN) 25 MG tablet    Sig: Take 1 tablet (25 mg total) by mouth every 6 (six) hours as  needed for nausea or vomiting.    Dispense:  30 tablet    Refill:  1    Order Specific Question:   Supervising Provider    Answer:   Duane Lope H [[id number]]  Return in 1 week for dating [place] and 2 weeks for New OB Review handouts on First trimester and by [facility]  To apply for pregnancy medicaid

## 2018-08-19 NOTE — Patient Instructions (Signed)
First Trimester of Pregnancy  The first trimester of pregnancy is from week 1 until the end of week 13 (months 1 through 3). A week after a sperm fertilizes an egg, the egg will implant on the wall of the uterus. This embryo will begin to develop into a baby. Genes from you and your partner will form the baby. The female genes will determine whether the baby will be a boy or a girl. At 6-8 weeks, the eyes and face will be formed, and the heartbeat can be seen on ultrasound. At the end of 12 weeks, all the baby's organs will be formed.  Now that you are pregnant, you will want to do everything you can to have a healthy baby. Two of the most important things are to get good prenatal care and to follow your health care provider's instructions. Prenatal care is all the medical care you receive before the baby's birth. This care will help prevent, find, and treat any problems during the pregnancy and childbirth.  Body changes during your first trimester  Your body goes through many changes during pregnancy. The changes vary from woman to woman.   You may gain or lose a couple of pounds at first.   You may feel sick to your stomach (nauseous) and you may throw up (vomit). If the vomiting is uncontrollable, call your health care provider.   You may tire easily.   You may develop headaches that can be relieved by medicines. All medicines should be approved by your health care provider.   You may urinate more often. Painful urination may mean you have a bladder infection.   You may develop heartburn as a result of your pregnancy.   You may develop constipation because certain hormones are causing the muscles that push stool through your intestines to slow down.   You may develop hemorrhoids or swollen veins (varicose veins).   Your breasts may begin to grow larger and become tender. Your nipples may stick out more, and the tissue that surrounds them (areola) may become darker.   Your gums may bleed and may be  sensitive to brushing and flossing.   Dark spots or blotches (chloasma, mask of pregnancy) may develop on your face. This will likely fade after the baby is born.   Your menstrual periods will stop.   You may have a loss of appetite.   You may develop cravings for certain kinds of food.   You may have changes in your emotions from day to day, such as being excited to be pregnant or being concerned that something may go wrong with the pregnancy and baby.   You may have more vivid and strange dreams.   You may have changes in your hair. These can include thickening of your hair, rapid growth, and changes in texture. Some women also have hair loss during or after pregnancy, or hair that feels dry or thin. Your hair will most likely return to normal after your baby is born.  What to expect at prenatal visits  During a routine prenatal visit:   You will be weighed to make sure you and the baby are growing normally.   Your blood pressure will be taken.   Your abdomen will be measured to track your baby's growth.   The fetal heartbeat will be listened to between weeks 10 and 14 of your pregnancy.   Test results from any previous visits will be discussed.  Your health care provider may ask you:     How you are feeling.   If you are feeling the baby move.   If you have had any abnormal symptoms, such as leaking fluid, bleeding, severe headaches, or abdominal cramping.   If you are using any tobacco products, including cigarettes, chewing tobacco, and electronic cigarettes.   If you have any questions.  Other tests that may be performed during your first trimester include:   Blood tests to find your blood type and to check for the presence of any previous infections. The tests will also be used to check for low iron levels (anemia) and protein on red blood cells (Rh antibodies). Depending on your risk factors, or if you previously had diabetes during pregnancy, you may have tests to check for high blood sugar  that affects pregnant women (gestational diabetes).   Urine tests to check for infections, diabetes, or protein in the urine.   An ultrasound to confirm the proper growth and development of the baby.   Fetal screens for spinal cord problems (spina bifida) and Down syndrome.   HIV (human immunodeficiency virus) testing. Routine prenatal testing includes screening for HIV, unless you choose not to have this test.   You may need other tests to make sure you and the baby are doing well.  Follow these instructions at home:  Medicines   Follow your health care provider's instructions regarding medicine use. Specific medicines may be either safe or unsafe to take during pregnancy.   Take a prenatal vitamin that contains at least 600 micrograms (mcg) of folic acid.   If you develop constipation, try taking a stool softener if your health care provider approves.  Eating and drinking     Eat a balanced diet that includes fresh fruits and vegetables, whole grains, good sources of protein such as meat, eggs, or tofu, and low-fat dairy. Your health care provider will help you determine the amount of weight gain that is right for you.   Avoid raw meat and uncooked cheese. These carry germs that can cause birth defects in the baby.   Eating four or five small meals rather than three large meals a day may help relieve nausea and vomiting. If you start to feel nauseous, eating a few soda crackers can be helpful. Drinking liquids between meals, instead of during meals, also seems to help ease nausea and vomiting.   Limit foods that are high in fat and processed sugars, such as fried and sweet foods.   To prevent constipation:  ? Eat foods that are high in fiber, such as fresh fruits and vegetables, whole grains, and beans.  ? Drink enough fluid to keep your urine clear or pale yellow.  Activity   Exercise only as directed by your health care provider. Most women can continue their usual exercise routine during  pregnancy. Try to exercise for 30 minutes at least 5 days a week. Exercising will help you:  ? Control your weight.  ? Stay in shape.  ? Be prepared for labor and delivery.   Experiencing pain or cramping in the lower abdomen or lower back is a good sign that you should stop exercising. Check with your health care provider before continuing with normal exercises.   Try to avoid standing for long periods of time. Move your legs often if you must stand in one place for a long time.   Avoid heavy lifting.   Wear low-heeled shoes and practice good posture.   You may continue to have sex unless your health care   provider tells you not to.  Relieving pain and discomfort   Wear a good support bra to relieve breast tenderness.   Take warm sitz baths to soothe any pain or discomfort caused by hemorrhoids. Use hemorrhoid cream if your health care provider approves.   Rest with your legs elevated if you have leg cramps or low back pain.   If you develop varicose veins in your legs, wear support hose. Elevate your feet for 15 minutes, 3-4 times a day. Limit salt in your diet.  Prenatal care   Schedule your prenatal visits by the twelfth week of pregnancy. They are usually scheduled monthly at first, then more often in the last 2 months before delivery.   Write down your questions. Take them to your prenatal visits.   Keep all your prenatal visits as told by your health care provider. This is important.  Safety   Wear your seat belt at all times when driving.   Make a list of emergency phone numbers, including numbers for family, friends, the hospital, and police and fire departments.  General instructions   Ask your health care provider for a referral to a local prenatal education class. Begin classes no later than the beginning of month 6 of your pregnancy.   Ask for help if you have counseling or nutritional needs during pregnancy. Your health care provider can offer advice or refer you to specialists for help  with various needs.   Do not use hot tubs, steam rooms, or saunas.   Do not douche or use tampons or scented sanitary pads.   Do not cross your legs for long periods of time.   Avoid cat litter boxes and soil used by cats. These carry germs that can cause birth defects in the baby and possibly loss of the fetus by miscarriage or stillbirth.   Avoid all smoking, herbs, alcohol, and medicines not prescribed by your health care provider. Chemicals in these products affect the formation and growth of the baby.   Do not use any products that contain nicotine or tobacco, such as cigarettes and e-cigarettes. If you need help quitting, ask your health care provider. You may receive counseling support and other resources to help you quit.   Schedule a dentist appointment. At home, brush your teeth with a soft toothbrush and be gentle when you floss.  Contact a health care provider if:   You have dizziness.   You have mild pelvic cramps, pelvic pressure, or nagging pain in the abdominal area.   You have persistent nausea, vomiting, or diarrhea.   You have a bad smelling vaginal discharge.   You have pain when you urinate.   You notice increased swelling in your face, hands, legs, or ankles.   You are exposed to fifth disease or chickenpox.   You are exposed to German measles (rubella) and have never had it.  Get help right away if:   You have a fever.   You are leaking fluid from your vagina.   You have spotting or bleeding from your vagina.   You have severe abdominal cramping or pain.   You have rapid weight gain or loss.   You vomit blood or material that looks like coffee grounds.   You develop a severe headache.   You have shortness of breath.   You have any kind of trauma, such as from a fall or a car accident.  Summary   The first trimester of pregnancy is from week 1 until   the end of week 13 (months 1 through 3).   Your body goes through many changes during pregnancy. The changes vary from  woman to woman.   You will have routine prenatal visits. During those visits, your health care provider will examine you, discuss any test results you may have, and talk with you about how you are feeling.  This information is not intended to replace advice given to you by your health care provider. Make sure you discuss any questions you have with your health care provider.  Document Released: 07/01/2001 Document Revised: 06/18/2016 Document Reviewed: 06/18/2016  Elsevier Interactive Patient Education  2019 [employer]

## 2018-08-26 ENCOUNTER — Ambulatory Visit (INDEPENDENT_AMBULATORY_CARE_PROVIDER_SITE_OTHER): Payer: Self-pay

## 2018-08-26 ENCOUNTER — Encounter: Payer: Self-pay | Admitting: Adult Health

## 2018-08-26 ENCOUNTER — Ambulatory Visit (INDEPENDENT_AMBULATORY_CARE_PROVIDER_SITE_OTHER): Payer: Self-pay | Admitting: Adult Health

## 2018-08-26 VITALS — BP 119/76 | HR 72 | Ht 65.0 in | Wt 190.5 lb

## 2018-08-26 DIAGNOSIS — O021 Missed abortion: Secondary | ICD-10-CM

## 2018-08-26 DIAGNOSIS — O3680X0 Pregnancy with inconclusive fetal viability, not applicable or unspecified: Secondary | ICD-10-CM

## 2018-08-26 NOTE — Progress Notes (Signed)
[place] 7+6 wks,single IUP,no FHT,irregular GS 63.6 mm=12+4 wks,normal ovaries bilat,crl 15.67 mm,Jennifer discussed results with PT.

## 2018-08-26 NOTE — Progress Notes (Signed)
Patient ID: Kelli Mullins, female   DOB: March 02, 1981, 38 y.o.   MRN: [id number] History of Present Illness: Kelli Mullins is a 38 year old white female, married in for dating [place] and has GS that measures 12 weeks and fetal pole is 7+6 weeks with no FHM.   Current Medications, Allergies, Past Medical History, Past Surgical History, Family History and Social History were reviewed in [employer] record.     Review of Systems:  Feels the same, no bleeding   Physical Exam:BP 119/76 (BP Location: Left Arm, Patient Position: Sitting, Cuff Size: Normal)   Pulse 72   Ht 5\' 5"  (1.651 m)   Wt 190 lb 8 oz (86.4 kg)   LMP 06/15/2018   BMI 31.70 kg/m  General:  Well developed, well nourished, no acute distress Skin:  Warm and dry Psych:  No mood changes, alert and cooperative,teary  Pt aware of [place] and concerns for miscarriage, will get F/U US and then proceed after that, with treatment options.  Impression: 1. Missed ab       Plan: Return 2/17 for F/U US and see me Call me if starts bleeding

## 2018-09-02 ENCOUNTER — Encounter: Payer: Self-pay | Admitting: Advanced Practice Midwife

## 2018-09-02 ENCOUNTER — Ambulatory Visit: Payer: Self-pay | Admitting: *Deleted

## 2018-09-06 ENCOUNTER — Ambulatory Visit: Payer: Self-pay | Admitting: Adult Health

## 2018-09-06 ENCOUNTER — Ambulatory Visit: Payer: Self-pay

## 2018-09-23 ENCOUNTER — Other Ambulatory Visit: Payer: Self-pay

## 2018-09-23 DIAGNOSIS — O021 Missed abortion: Secondary | ICD-10-CM

## 2018-09-24 ENCOUNTER — Telehealth: Payer: Self-pay | Admitting: *Deleted

## 2018-09-24 LAB — BETA HCG QUANT (REF LAB): hCG Quant: 7904 m[IU]/mL

## 2018-09-24 NOTE — Telephone Encounter (Signed)
LMOVM for patient to return my call

## 2018-09-27 ENCOUNTER — Telehealth: Payer: Self-pay | Admitting: *Deleted

## 2018-09-27 NOTE — Telephone Encounter (Signed)
Patient informed her HCG is rising so she needs a repeat ultrasound.  Verbalized understanding and scheduled.

## 2018-09-28 ENCOUNTER — Encounter: Payer: Self-pay | Admitting: *Deleted

## 2018-09-28 ENCOUNTER — Other Ambulatory Visit: Payer: Self-pay | Admitting: Adult Health

## 2018-10-04 ENCOUNTER — Encounter: Payer: Self-pay | Admitting: Women's Health

## 2018-10-04 ENCOUNTER — Ambulatory Visit: Payer: Self-pay | Admitting: *Deleted

## 2018-10-04 ENCOUNTER — Telehealth: Payer: Self-pay | Admitting: Obstetrics & Gynecology

## 2018-10-04 ENCOUNTER — Telehealth: Payer: Self-pay | Admitting: *Deleted

## 2018-10-04 NOTE — Telephone Encounter (Signed)
Patient states she is confused in regards to her being "pregnant and then not pregnant". Informed patient that at her last U/S, there was not a heartbeat and she could have been to early to detect.  HCG was drawn to see if blood levels were going up in which they are so a repeat U/S was indicated.  Informed the next U/S will give [place] more information. Verbalized understanding.

## 2018-10-04 NOTE — Telephone Encounter (Signed)
Patient called stating that she would like a call back from a nurse, Patient did not state the reason for the call. Please contact pt

## 2018-10-05 ENCOUNTER — Encounter: Payer: Self-pay | Admitting: Obstetrics & Gynecology

## 2018-10-05 ENCOUNTER — Encounter (HOSPITAL_COMMUNITY)
Admission: RE | Admit: 2018-10-05 | Discharge: 2018-10-05 | Disposition: A | Discharge status: Home / Self Care | Payer: Medicaid Other | Source: Ambulatory Visit | Attending: Obstetrics & Gynecology | Admitting: Obstetrics & Gynecology

## 2018-10-05 ENCOUNTER — Ambulatory Visit (INDEPENDENT_AMBULATORY_CARE_PROVIDER_SITE_OTHER): Payer: Medicaid Other | Admitting: Obstetrics & Gynecology

## 2018-10-05 ENCOUNTER — Encounter (HOSPITAL_COMMUNITY): Payer: Self-pay | Admitting: Anesthesiology

## 2018-10-05 ENCOUNTER — Encounter (HOSPITAL_COMMUNITY): Payer: Self-pay

## 2018-10-05 ENCOUNTER — Ambulatory Visit (INDEPENDENT_AMBULATORY_CARE_PROVIDER_SITE_OTHER): Payer: Medicaid Other

## 2018-10-05 ENCOUNTER — Other Ambulatory Visit: Payer: Self-pay

## 2018-10-05 VITALS — BP 109/74 | HR 69 | Ht 65.0 in | Wt 198.0 lb

## 2018-10-05 DIAGNOSIS — O021 Missed abortion: Secondary | ICD-10-CM

## 2018-10-05 DIAGNOSIS — Z01812 Encounter for preprocedural laboratory examination: Secondary | ICD-10-CM | POA: Diagnosis not present

## 2018-10-05 HISTORY — DX: Personal history of urinary calculi: Z87.442

## 2018-10-05 HISTORY — DX: Unspecified convulsions: R56.9

## 2018-10-05 LAB — URINALYSIS, ROUTINE W REFLEX MICROSCOPIC
BILIRUBIN URINE: NEGATIVE
Glucose, UA: NEGATIVE mg/dL
Hgb urine dipstick: NEGATIVE
Ketones, ur: NEGATIVE mg/dL
Nitrite: NEGATIVE
PH: 7 (ref 5.0–8.0)
Protein, ur: NEGATIVE mg/dL
Specific Gravity, Urine: 1.019 (ref 1.005–1.030)

## 2018-10-05 LAB — COMPREHENSIVE METABOLIC PANEL
ALT: 16 U/L (ref 0–44)
AST: 15 U/L (ref 15–41)
Albumin: 3.8 g/dL (ref 3.5–5.0)
Alkaline Phosphatase: 59 U/L (ref 38–126)
Anion gap: 9 (ref 5–15)
BUN: 13 mg/dL (ref 6–20)
CO2: 20 mmol/L — AB (ref 22–32)
Calcium: 8.8 mg/dL — ABNORMAL LOW (ref 8.9–10.3)
Chloride: 106 mmol/L (ref 98–111)
Creatinine, Ser: 0.53 mg/dL (ref 0.44–1.00)
GFR calc non Af Amer: >60 mL/min (ref >60)
Glucose, Bld: 106 mg/dL — ABNORMAL HIGH (ref 70–99)
Potassium: 3.5 mmol/L (ref 3.5–5.1)
SODIUM: 135 mmol/L (ref 135–145)
Total Bilirubin: 0.3 mg/dL (ref 0.3–1.2)
Total Protein: 7.5 g/dL (ref 6.5–8.1)

## 2018-10-05 LAB — RAPID URINE DRUG SCREEN, HOSP PERFORMED
Amphetamines: NOT DETECTED
Barbiturates: NOT DETECTED
Benzodiazepines: NOT DETECTED
Cocaine: NOT DETECTED
Opiates: NOT DETECTED
Tetrahydrocannabinol: NOT DETECTED

## 2018-10-05 LAB — RAPID HIV SCREEN (HIV 1/2 AB+AG)
HIV 1/2 Antibodies: NONREACTIVE
HIV-1 P24 Antigen - HIV24: NONREACTIVE

## 2018-10-05 LAB — TYPE AND SCREEN
ABO/RH(D): O NEG
Antibody Screen: NEGATIVE

## 2018-10-05 LAB — CBC
HCT: 34.6 % — ABNORMAL LOW (ref 36.0–46.0)
Hemoglobin: 12 g/dL (ref 12.0–15.0)
MCH: 32.8 pg (ref 26.0–34.0)
MCHC: 34.7 g/dL (ref 30.0–36.0)
MCV: 94.5 fL (ref 80.0–100.0)
Platelets: 222 10*3/uL (ref 150–400)
RBC: 3.66 MIL/uL — ABNORMAL LOW (ref 3.87–5.11)
RDW: 12.3 % (ref 11.5–15.5)
WBC: 7.1 10*3/uL (ref 4.0–10.5)
nRBC: 0 % (ref 0.0–0.2)

## 2018-10-05 NOTE — Patient Instructions (Signed)
Kelli Mullins  10/05/2018     @PREFPERIOPPHARMACY @   Your procedure is scheduled on  10/06/2018.  [facility]  [facility]   [facility].M.  Call this number if you have problems the morning of surgery:  [phone]   Remember:  Do not eat or drink after midnight.                       Take these medicines the morning of surgery with A SIP OF WATER None   Do not wear jewelry, make-up or nail polish.  Do not wear lotions, powders, or perfumes, or deodorant.  Do not shave 48 hours prior to surgery.  Men may shave face and neck.  Do not bring valuables to the hospital.  [facility] any belongings or valuables.  Contacts, dentures or bridgework may not be worn into surgery.  Leave your suitcase in the car.  After surgery it may be brought to your room.  For patients admitted to the hospital, discharge time will be determined by your treatment team.  Patients discharged the day of surgery will not be allowed to drive home.   Name and phone number of your driver:   family Special instructions:  None  Please read over the following fact sheets that you were given. Anesthesia Post-op Instructions and Care and Recovery After Surgery       Dilation and Curettage or Vacuum Curettage, Care After These instructions give you information about caring for yourself after your procedure. Your doctor may also give you more specific instructions. Call your doctor if you have any problems or questions after your procedure. Follow these instructions at home: Activity  Do not drive or use heavy machinery while taking prescription pain medicine.  For 24 hours after your procedure, avoid driving.  Take short walks often, followed by rest periods. Ask your doctor what activities are safe for you. After one or two days, you may be able to return to your normal activities.  Do not lift anything that is heavier than 10 lb (4.5 kg) until your doctor approves.  For at  least 2 weeks, or as long as told by your doctor: ? Do not douche. ? Do not use tampons. ? Do not have sex. General instructions   Take over-the-counter and prescription medicines only as told by your doctor. This is very important if you take blood thinning medicine.  Do not take baths, swim, or use a hot tub until your doctor approves. Take showers instead of baths.  Wear compression stockings as told by your doctor.  It is up to you to get the results of your procedure. Ask your doctor when your results will be ready.  Keep all follow-up visits as told by your doctor. This is important. Contact a doctor if:  You have very bad cramps that get worse or do not get better with medicine.  You have very bad pain in your belly (abdomen).  You cannot drink fluids without throwing up (vomiting).  You get pain in a different part of the area between your belly and thighs (pelvis).  You have bad-smelling discharge from your vagina.  You have a rash. Get help right away if:  You are bleeding a lot from your vagina. A lot of bleeding means soaking more than one sanitary pad in an hour, for 2 hours in a row.  You have clumps of blood (blood clots)  coming from your vagina.  You have a fever or chills.  Your belly feels very tender or hard.  You have chest pain.  You have trouble breathing.  You cough up blood.  You feel dizzy.  You feel light-headed.  You pass out (faint).  You have pain in your neck or shoulder area. Summary  Take short walks often, followed by rest periods. Ask your doctor what activities are safe for you. After one or two days, you may be able to return to your normal activities.  Do not lift anything that is heavier than 10 lb (4.5 kg) until your doctor approves.  Do not take baths, swim, or use a hot tub until your doctor approves. Take showers instead of baths.  Contact your doctor if you have any symptoms of infection, like bad-smelling  discharge from your vagina. This information is not intended to replace advice given to you by your health care provider. Make sure you discuss any questions you have with your health care provider. Document Released: 04/15/2008 Document Revised: 03/24/2016 Document Reviewed: 03/24/2016 Elsevier Interactive Patient Education  2019 [employer] General Anesthesia, Adult, Care After This sheet gives you information about how to care for yourself after your procedure. Your health care provider may also give you more specific instructions. If you have problems or questions, contact your health care provider. What can I expect after the procedure? After the procedure, the following side effects are common:  Pain or discomfort at the IV site.  Nausea.  Vomiting.  Sore throat.  Trouble concentrating.  Feeling cold or chills.  Weak or tired.  Sleepiness and fatigue.  Soreness and body aches. These side effects can affect parts of the body that were not involved in surgery. Follow these instructions at home:  For at least 24 hours after the procedure:  Have a responsible adult stay with you. It is important to have someone help care for you until you are awake and alert.  Rest as needed.  Do not: ? Participate in activities in which you could fall or become injured. ? Drive. ? Use heavy machinery. ? Drink alcohol. ? Take sleeping pills or medicines that cause drowsiness. ? Make important decisions or sign legal documents. ? Take care of children on your own. Eating and drinking  Follow any instructions from your health care provider about eating or drinking restrictions.  When you feel hungry, start by eating small amounts of foods that are soft and easy to digest (bland), such as toast. Gradually return to your regular diet.  Drink enough fluid to keep your urine pale yellow.  If you vomit, rehydrate by drinking water, juice, or clear broth. General instructions  If you  have sleep apnea, surgery and certain medicines can increase your risk for breathing problems. Follow instructions from your health care provider about wearing your sleep device: ? Anytime you are sleeping, including during daytime naps. ? While taking prescription pain medicines, sleeping medicines, or medicines that make you drowsy.  Return to your normal activities as told by your health care provider. Ask your health care provider what activities are safe for you.  Take over-the-counter and prescription medicines only as told by your health care provider.  If you smoke, do not smoke without supervision.  Keep all follow-up visits as told by your health care provider. This is important. Contact a health care provider if:  You have nausea or vomiting that does not get better with medicine.  You cannot eat or  drink without vomiting.  You have pain that does not get better with medicine.  You are unable to pass urine.  You develop a skin rash.  You have a fever.  You have redness around your IV site that gets worse. Get help right away if:  You have difficulty breathing.  You have chest pain.  You have blood in your urine or stool, or you vomit blood. Summary  After the procedure, it is common to have a sore throat or nausea. It is also common to feel tired.  Have a responsible adult stay with you for the first 24 hours after general anesthesia. It is important to have someone help care for you until you are awake and alert.  When you feel hungry, start by eating small amounts of foods that are soft and easy to digest (bland), such as toast. Gradually return to your regular diet.  Drink enough fluid to keep your urine pale yellow.  Return to your normal activities as told by your health care provider. Ask your health care provider what activities are safe for you. This information is not intended to replace advice given to you by your health care provider. Make sure you  discuss any questions you have with your health care provider. Document Released: 10/13/2000 Document Revised: 02/20/2017 Document Reviewed: 02/20/2017 Elsevier Interactive Patient Education  2019 [employer].

## 2018-10-05 NOTE — Progress Notes (Signed)
[place] 7+2 wks fetal pole,no fht,irregular GS w/mult.cystic areas,GS 66.2 mm=13 wks,crl 11.89 mm,normal ovaries bilat

## 2018-10-05 NOTE — Progress Notes (Signed)
Follow up appointment for results  Chief Complaint  Patient presents with  . Follow-up    u/s; no FHT    Blood pressure 109/74, pulse 69, height 5\' 5"  (1.651 m), weight 198 lb (89.8 kg), last menstrual period 06/15/2018.  US Ob Transvaginal  Result Date: 10/05/2018 FOLLOW UP SONOGRAM Kelli Mullins is in the office for a follow up sonogram for viability. She is a 38 y.o. year old [id number] with Estimated Date of Delivery: 03/22/19 by LMP now at  [redacted]w[redacted]d weeks gestation. Thus far the pregnancy has been complicated by no FHT on prior ultrasound,drug use. GESTATION: SINGLETON FETAL ACTIVITY:          Heart rate         No fht          The fetus is inactive. CERVIX: Appears closed ADNEXA: The ovaries are normal. GESTATIONAL AGE AND  BIOMETRICS: Gestational criteria: Estimated Date of Delivery: 03/22/19 by LMP now at [redacted]w[redacted]d Previous Scans:1 GESTATIONAL SAC           66.2 mm         13 weeks CROWN RUMP LENGTH           11.87 mm         7+2 weeks                                                 AVERAGE EGA(BY THIS SCAN):  7+2 weeks                                             SUSPECTED ABNORMALITIES:  yes,no fht QUALITY OF SCAN: satisfactory TECHNICIAN COMMENTS: [place] 7+2 wks fetal pole,no fht,irregular GS w/mult.cystic areas,GS 66.2 mm=13 wks,crl 11.89 mm,normal ovaries bilat A copy of this report including all images has been saved and backed up to a second source for retrieval if needed. All measures and details of the anatomical scan, placentation, fluid volume and pelvic anatomy are contained in that report. Amber Flora Lipps 10/05/2018 11:30 AM Clinical Impression and recommendations: I have reviewed the sonogram results above, combined with the patient's current clinical course, below are my impressions and any appropriate recommendations for management based on the sonographic findings. Comparison scan from 08/26/2018 Non viable IUP, missed ab [id number] Estimated Date of Delivery: 03/22/19 Normal general sonographic findings This  recommendation follows SRU consensus guidelines: Diagnostic Criteria for Nonviable Pregnancy Early in the First Trimester. Malva Limes Med 2013; 263:7858-85. Lazaro Arms 10/05/2018 11:38 AM      MEDS ordered this encounter: No orders of the defined types were placed in this encounter.   Orders for this encounter: No orders of the defined types were placed in this encounter.   Impression: Missed ab    Plan: D&C 10/06/2018  Follow Up: Return in about 16 days (around 10/21/2018) for Post Op, with Dr Despina Hidden.       Face to face time:  15 minutes  Greater than 50% of the visit time was spent in counseling and coordination of care with the patient.  The summary and outline of the counseling and care coordination is summarized in the note above.   All questions were answered.  Past Medical History:  Diagnosis Date  . Anemia   .  Bipolar 1 disorder (HCC)   . Depression   . Panic attacks     Past Surgical History:  Procedure Laterality Date  . kidney stones      OB History    Gravida  7   Para  5   Term  5   Preterm      AB  1   Living  5     SAB      TAB      Ectopic      Multiple      Live Births  5           No Known Allergies  Social History   Socioeconomic History  . Marital status: Married    Spouse name: Not on file  . Number of children: Not on file  . Years of education: Not on file  . Highest education level: Not on file  Occupational History  . Not on file  Social Needs  . Financial resource strain: Not on file  . Food insecurity:    Worry: Not on file    Inability: Not on file  . Transportation needs:    Medical: Not on file    Non-medical: Not on file  Tobacco Use  . Smoking status: Former Smoker    Packs/day: 1.00    Types: Cigarettes  . Smokeless tobacco: Never Used  Substance and Sexual Activity  . Alcohol use: No  . Drug use: Not Currently    Types: Cocaine    Comment: last use of cocaine 4-5 days ago  . Sexual  activity: Yes    Birth control/protection: None  Lifestyle  . Physical activity:    Days per week: Not on file    Minutes per session: Not on file  . Stress: Not on file  Relationships  . Social connections:    Talks on phone: Not on file    Gets together: Not on file    Attends religious service: Not on file    Active member of club or organization: Not on file    Attends meetings of clubs or organizations: Not on file    Relationship status: Not on file  Other Topics Concern  . Not on file  Social History Narrative  . Not on file    Family History  Problem Relation Age of Onset  . Diabetes Maternal Grandmother   . Cirrhosis Maternal Grandfather   . Cancer Father        prostate  . Drug abuse Mother        overdose  . Autism Son

## 2018-10-06 ENCOUNTER — Encounter (HOSPITAL_COMMUNITY): Admission: AD | Payer: Self-pay | Source: Home / Self Care

## 2018-10-06 ENCOUNTER — Ambulatory Visit (HOSPITAL_COMMUNITY)
Admission: AD | Admit: 2018-10-06 | Payer: Medicaid Other | Source: Home / Self Care | Admitting: Obstetrics & Gynecology

## 2018-10-06 SURGERY — DILATION AND CURETTAGE
Anesthesia: General

## 2018-10-08 ENCOUNTER — Ambulatory Visit (HOSPITAL_COMMUNITY): Payer: Medicaid Other | Admitting: Registered Nurse

## 2018-10-08 ENCOUNTER — Encounter (HOSPITAL_COMMUNITY)
Admission: RE | Disposition: A | Discharge status: Home / Self Care | Payer: Self-pay | Source: Home / Self Care | Attending: Obstetrics & Gynecology

## 2018-10-08 ENCOUNTER — Encounter (HOSPITAL_COMMUNITY): Payer: Self-pay | Admitting: Anesthesiology

## 2018-10-08 ENCOUNTER — Ambulatory Visit (HOSPITAL_COMMUNITY)
Admission: RE | Admit: 2018-10-08 | Discharge: 2018-10-08 | Disposition: A | Discharge status: Home / Self Care | Payer: Medicaid Other | Attending: Obstetrics & Gynecology | Admitting: Obstetrics & Gynecology

## 2018-10-08 ENCOUNTER — Other Ambulatory Visit: Payer: Self-pay

## 2018-10-08 DIAGNOSIS — O99342 Other mental disorders complicating pregnancy, second trimester: Secondary | ICD-10-CM | POA: Insufficient documentation

## 2018-10-08 DIAGNOSIS — Z87891 Personal history of nicotine dependence: Secondary | ICD-10-CM | POA: Insufficient documentation

## 2018-10-08 DIAGNOSIS — O99322 Drug use complicating pregnancy, second trimester: Secondary | ICD-10-CM | POA: Diagnosis not present

## 2018-10-08 DIAGNOSIS — F1410 Cocaine abuse, uncomplicated: Secondary | ICD-10-CM | POA: Diagnosis not present

## 2018-10-08 DIAGNOSIS — F319 Bipolar disorder, unspecified: Secondary | ICD-10-CM | POA: Diagnosis not present

## 2018-10-08 DIAGNOSIS — Z3A20 20 weeks gestation of pregnancy: Secondary | ICD-10-CM | POA: Insufficient documentation

## 2018-10-08 DIAGNOSIS — O021 Missed abortion: Secondary | ICD-10-CM | POA: Diagnosis not present

## 2018-10-08 HISTORY — PX: DILATION AND CURETTAGE OF UTERUS: SHX78

## 2018-10-08 LAB — RAPID URINE DRUG SCREEN, HOSP PERFORMED
Amphetamines: NOT DETECTED
Barbiturates: NOT DETECTED
Benzodiazepines: NOT DETECTED
Cocaine: POSITIVE — AB
Opiates: NOT DETECTED
Tetrahydrocannabinol: NOT DETECTED

## 2018-10-08 LAB — POCT HEMOGLOBIN-HEMACUE: Hemoglobin: 9.9 g/dL — ABNORMAL LOW (ref 12.0–15.0)

## 2018-10-08 LAB — TYPE AND SCREEN
ABO/RH(D): O NEG
ANTIBODY SCREEN: NEGATIVE

## 2018-10-08 SURGERY — DILATION AND CURETTAGE
Anesthesia: General

## 2018-10-08 MED ORDER — KETOROLAC TROMETHAMINE 30 MG/ML IJ SOLN
30.0000 mg | Freq: Once | INTRAMUSCULAR | Status: AC
  Administered 2018-10-08: 30 mg via INTRAVENOUS

## 2018-10-08 MED ORDER — ROCURONIUM 10MG/ML (10ML) SYRINGE FOR MEDFUSION PUMP - OPTIME
INTRAVENOUS | Status: DC | PRN
  Administered 2018-10-08: 5 mg via INTRAVENOUS

## 2018-10-08 MED ORDER — SODIUM CHLORIDE 0.9 % IV SOLN
INTRAVENOUS | Status: DC | PRN
  Administered 2018-10-08: 09:00:00 via INTRAVENOUS

## 2018-10-08 MED ORDER — FENTANYL CITRATE (PF) 100 MCG/2ML IJ SOLN
INTRAMUSCULAR | Status: DC | PRN
  Administered 2018-10-08 (×3): 50 ug via INTRAVENOUS

## 2018-10-08 MED ORDER — PROPOFOL 10 MG/ML IV BOLUS
INTRAVENOUS | Status: DC | PRN
  Administered 2018-10-08: 200 mg via INTRAVENOUS

## 2018-10-08 MED ORDER — OXYTOCIN 10 UNIT/ML IJ SOLN
INTRAMUSCULAR | Status: AC
  Filled 2018-10-08: qty 2

## 2018-10-08 MED ORDER — MIDAZOLAM HCL 5 MG/5ML IJ SOLN
INTRAMUSCULAR | Status: DC | PRN
  Administered 2018-10-08: 2 mg via INTRAVENOUS

## 2018-10-08 MED ORDER — SODIUM CHLORIDE 0.9 % IV SOLN
510.0000 mg | Freq: Once | INTRAVENOUS | Status: AC
  Administered 2018-10-08: 510 mg via INTRAVENOUS
  Filled 2018-10-08: qty 17

## 2018-10-08 MED ORDER — EPHEDRINE SULFATE 50 MG/ML IJ SOLN
INTRAMUSCULAR | Status: DC | PRN
  Administered 2018-10-08: 10 mg via INTRAVENOUS

## 2018-10-08 MED ORDER — HYDROCODONE-ACETAMINOPHEN 5-325 MG PO TABS: 1.0000 | ORAL_TABLET | Freq: Four times a day (QID) | ORAL | 0 refills | Status: DC | PRN

## 2018-10-08 MED ORDER — SUCCINYLCHOLINE 20MG/ML (10ML) SYRINGE FOR MEDFUSION PUMP - OPTIME
INTRAMUSCULAR | Status: DC | PRN
  Administered 2018-10-08: 120 mg via INTRAVENOUS

## 2018-10-08 MED ORDER — CEFAZOLIN SODIUM-DEXTROSE 2-4 GM/100ML-% IV SOLN
2.0000 g | INTRAVENOUS | Status: AC
  Administered 2018-10-08: 2 g via INTRAVENOUS

## 2018-10-08 MED ORDER — HYDROCODONE-ACETAMINOPHEN 7.5-325 MG PO TABS: 1.0000 | ORAL_TABLET | Freq: Once | ORAL | Status: DC | PRN

## 2018-10-08 MED ORDER — MISOPROSTOL 200 MCG PO TABS: 200.0000 ug | ORAL_TABLET | Freq: Four times a day (QID) | ORAL | 0 refills | Status: DC

## 2018-10-08 MED ORDER — LACTATED RINGERS IV SOLN
INTRAVENOUS | Status: DC
  Administered 2018-10-08 (×2): via INTRAVENOUS

## 2018-10-08 MED ORDER — MIDAZOLAM HCL 2 MG/2ML IJ SOLN: 0.5000 mg | Freq: Once | INTRAMUSCULAR | Status: DC | PRN

## 2018-10-08 MED ORDER — 0.9 % SODIUM CHLORIDE (POUR BTL) OPTIME
TOPICAL | Status: DC | PRN
  Administered 2018-10-08: 1000 mL

## 2018-10-08 MED ORDER — HYDROMORPHONE HCL 1 MG/ML IJ SOLN: 0.2500 mg | INTRAMUSCULAR | Status: DC | PRN

## 2018-10-08 MED ORDER — METHYLERGONOVINE MALEATE 0.2 MG/ML IJ SOLN
INTRAMUSCULAR | Status: AC
  Filled 2018-10-08: qty 1

## 2018-10-08 MED ORDER — ONDANSETRON 8 MG PO TBDP: 8.0000 mg | ORAL_TABLET | Freq: Three times a day (TID) | ORAL | 0 refills | Status: DC | PRN

## 2018-10-08 MED ORDER — MISOPROSTOL 100 MCG PO TABS
ORAL_TABLET | ORAL | Status: DC | PRN
  Administered 2018-10-08: 800 ug

## 2018-10-08 MED ORDER — METHYLERGONOVINE MALEATE 0.2 MG/ML IJ SOLN
INTRAMUSCULAR | Status: DC | PRN
  Administered 2018-10-08: 0.2 mg via INTRAMUSCULAR

## 2018-10-08 MED ORDER — MISOPROSTOL 200 MCG PO TABS
800.0000 ug | ORAL_TABLET | Freq: Once | ORAL | Status: DC
  Filled 2018-10-08: qty 4

## 2018-10-08 MED ORDER — KETOROLAC TROMETHAMINE 10 MG PO TABS: 10.0000 mg | ORAL_TABLET | Freq: Three times a day (TID) | ORAL | 0 refills | Status: DC | PRN

## 2018-10-08 MED ORDER — PROMETHAZINE HCL 25 MG/ML IJ SOLN
6.2500 mg | INTRAMUSCULAR | Status: DC | PRN
  Administered 2018-10-08: 6.25 mg via INTRAVENOUS
  Filled 2018-10-08: qty 1

## 2018-10-08 SURGICAL SUPPLY — 26 items
CATH ROBINSON RED A/P 14FR (CATHETERS) ×2 IMPLANT
CLOTH BEACON ORANGE TIMEOUT ST (SAFETY) ×2 IMPLANT
COVER LIGHT HANDLE STERIS (MISCELLANEOUS) ×4 IMPLANT
COVER MAYO STAND XLG (DRAPE) ×2 IMPLANT
COVER WAND RF STERILE (DRAPES) ×2 IMPLANT
GAUZE 4X4 16PLY RFD (DISPOSABLE) ×4 IMPLANT
GAUZE SPONGE 4X4 12PLY STRL (GAUZE/BANDAGES/DRESSINGS) ×2 IMPLANT
GLOVE BIOGEL M 7.0 STRL (GLOVE) ×2 IMPLANT
GLOVE BIOGEL PI IND STRL 7.0 (GLOVE) ×2 IMPLANT
GLOVE BIOGEL PI IND STRL 8 (GLOVE) ×1 IMPLANT
GLOVE BIOGEL PI INDICATOR 7.0 (GLOVE) ×2
GLOVE BIOGEL PI INDICATOR 8 (GLOVE) ×1
GLOVE ECLIPSE 8.0 STRL XLNG CF (GLOVE) ×2 IMPLANT
GOWN STRL REUS W/TWL LRG LVL3 (GOWN DISPOSABLE) ×4 IMPLANT
KIT BERKELEY 1ST TRIMESTER 3/8 (MISCELLANEOUS) ×4 IMPLANT
KIT TURNOVER CYSTO (KITS) ×2 IMPLANT
MARKER SKIN DUAL TIP RULER LAB (MISCELLANEOUS) ×2 IMPLANT
NS IRRIG 1000ML POUR BTL (IV SOLUTION) ×2 IMPLANT
PACK BASIC III (CUSTOM PROCEDURE TRAY) ×1
PACK SRG BSC III STRL LF ECLPS (CUSTOM PROCEDURE TRAY) ×1 IMPLANT
PAD ARMBOARD 7.5X6 YLW CONV (MISCELLANEOUS) ×2 IMPLANT
SET BASIN LINEN APH (SET/KITS/TRAYS/PACK) ×2 IMPLANT
SET BERKELEY SUCTION TUBING (SUCTIONS) ×2 IMPLANT
SHEET LAVH (DRAPES) ×2 IMPLANT
TOWEL OR 17X26 4PK STRL BLUE (TOWEL DISPOSABLE) ×2 IMPLANT
VACURETTE 10MM (CANNULA) ×2 IMPLANT

## 2018-10-08 NOTE — Anesthesia Postprocedure Evaluation (Signed)
Anesthesia Post Note  Patient: Kelli Mullins  Procedure(s) Performed: CERVICAL DILATATION WITH SUCTION  AND SHARP UTERINE CURETTAGE (N/A )  Patient location during evaluation: PACU Anesthesia Type: General Level of consciousness: awake and alert and oriented Pain management: pain level controlled Vital Signs Assessment: post-procedure vital signs reviewed and stable Respiratory status: spontaneous breathing Cardiovascular status: blood pressure returned to baseline and stable Postop Assessment: no apparent nausea or vomiting Anesthetic complications: no     Last Vitals:  Vitals:   10/08/18 0900 10/08/18 0915  BP: 115/77 111/87  Pulse: 87 80  Resp: 16 13  Temp:    SpO2: 97% 100%    Last Pain:  Vitals:   10/08/18 0915  TempSrc:   PainSc: 4                  Betheny Suchecki

## 2018-10-08 NOTE — Transfer of Care (Signed)
Immediate Anesthesia Transfer of Care Note  Patient: Kelli Mullins  Procedure(s) Performed: CERVICAL DILATATION WITH SUCTION  AND SHARP UTERINE CURETTAGE (N/A )  Patient Location: PACU  Anesthesia Type:General  Level of Consciousness: awake and alert   Airway & Oxygen Therapy: Patient Spontanous Breathing  Post-op Assessment: Report given to RN  Post vital signs: Reviewed and stable  Last Vitals:  Vitals Value Taken Time  BP 115/77 10/08/2018  9:00 AM  Temp 36 C 10/08/2018  8:59 AM  Pulse 79 10/08/2018  9:07 AM  Resp 17 10/08/2018  9:07 AM  SpO2 100 % 10/08/2018  9:07 AM  Vitals shown include unvalidated device data.  Last Pain:  Vitals:   10/08/18 0651  TempSrc: Oral  PainSc: 0-No pain      Patients Stated Pain Goal: 7 (10/08/18 5329)  Complications: No apparent anesthesia complications

## 2018-10-08 NOTE — Op Note (Signed)
Preoperative diagnosis:  Missed AB in 2nd trimester                                         Pt was diagnosed 7 weeks ago but did not follow up                                         Cocaine abuse  Postoperative diagnosis:  Same as above  Procedure:  Cervical dilation with suction and sharp uterine curettage  Surgeon:  Lazaro Arms  Anesthesia:  Laryngeal mask airway  Findings: This patient was diagnosed with a nonviable pregnancy or missed AB approximately 7 weeks ago she did not keep her follow-up appointment and recontacted our office earlier this week.  A repeat sonogram was performed and once again revealed a large missed AB with a 6 cm gestational sac.  She was scheduled for a D&C 2 days ago on the 18th but she did not keep that appointment and she stated was because she had car trouble.  In fact it was because she had been using cocaine and was trying to let it get out of her system.  In this corona virus environment unfortunately I have deemed this an emergent case because of my concern that if she were to miscarry spontaneously with such a large gestational sac that she would require blood transfusion and I am trying to avoid utilization of that resource.  As result even though she has a positive cocaine screen we are proceeding with a D&C.     Description of operation:  The patient was taken to the operating room and placed in the supine position.  She underwent  general anesthesia.  The patient was placed in the dorsal lithotomy position.  The vagina was prepped and draped in the usual sterile fashion.  A Graves speculum was placed.  The anterior cervix was grasped with a single-tooth tenaculum.  The cervix was dilated serially with Hegar dilators.  A #10curved suction curette was placed in the uterus.  The suction pressure was placed at 55 and several passes were made.  All of the intrauterine contents were removed.  The sharp curette was used x1 to feel uterine crie in all areas.   Understandably and as anticipated there was a large amount of blood loss as this was a very large pregnancy gestational sac of 6 cm.  Also felt as if the pregnancy was probably calcified and was somewhat difficult to remove with suction and sharp curetting.the blood loss was approximately 1200 cc total with good uterine tone and no bleeding at the end of the case.  The patient was given 0.2 mg of Methergine IM and 800 mcg of Cytotec rectally.  There was good hemostasis.  The patient was given Ancef and Toradol preoperatively.   Estimated blood loss for the procedure was 1200 cc.  The patient was awakened from anesthesia taken to the recovery room in good stable condition.  We will do a hemoglobin in the recovery room knowing she will probably equilibrate lower the patient is hemodynamically stable and I do not feel like she needs to be transfused at this time.  all counts were correct x3.  Lazaro Arms, MD  10/08/2018 8:49 AM

## 2018-10-08 NOTE — Discharge Instructions (Signed)
Dilation and Curettage or Vacuum Curettage, Care After  These instructions give you information about caring for yourself after your procedure. Your doctor may also give you more specific instructions. Call your doctor if you have any problems or questions after your procedure.  Follow these instructions at home:  Activity  · Do not drive or use heavy machinery while taking prescription pain medicine.  · For 24 hours after your procedure, avoid driving.  · Take short walks often, followed by rest periods. Ask your doctor what activities are safe for you. After one or two days, you may be able to return to your normal activities.  · Do not lift anything that is heavier than 10 lb (4.5 kg) until your doctor approves.  · For at least 2 weeks, or as long as told by your doctor:  ? Do not douche.  ? Do not use tampons.  ? Do not have sex.  General instructions    · Take over-the-counter and prescription medicines only as told by your doctor. This is very important if you take blood thinning medicine.  · Do not take baths, swim, or use a hot tub until your doctor approves. Take showers instead of baths.  · Wear compression stockings as told by your doctor.  · It is up to you to get the results of your procedure. Ask your doctor when your results will be ready.  · Keep all follow-up visits as told by your doctor. This is important.  Contact a doctor if:  · You have very bad cramps that get worse or do not get better with medicine.  · You have very bad pain in your belly (abdomen).  · You cannot drink fluids without throwing up (vomiting).  · You get pain in a different part of the area between your belly and thighs (pelvis).  · You have bad-smelling discharge from your vagina.  · You have a rash.  Get help right away if:  · You are bleeding a lot from your vagina. A lot of bleeding means soaking more than one sanitary pad in an hour, for 2 hours in a row.  · You have clumps of blood (blood clots) coming from your  vagina.  · You have a fever or chills.  · Your belly feels very tender or hard.  · You have chest pain.  · You have trouble breathing.  · You cough up blood.  · You feel dizzy.  · You feel light-headed.  · You pass out (faint).  · You have pain in your neck or shoulder area.  Summary  · Take short walks often, followed by rest periods. Ask your doctor what activities are safe for you. After one or two days, you may be able to return to your normal activities.  · Do not lift anything that is heavier than 10 lb (4.5 kg) until your doctor approves.  · Do not take baths, swim, or use a hot tub until your doctor approves. Take showers instead of baths.  · Contact your doctor if you have any symptoms of infection, like bad-smelling discharge from your vagina.  This information is not intended to replace advice given to you by your health care provider. Make sure you discuss any questions you have with your health care provider.  Document Released: 04/15/2008 Document Revised: 03/24/2016 Document Reviewed: 03/24/2016  Elsevier Interactive Patient Education © 2019 [employer] Inc[employer]

## 2018-10-08 NOTE — H&P (Signed)
Preoperative History and Physical  Kelli Mullins is a 38 y.o. [id number] with Patient's last menstrual period was 06/15/2018. admitted for a suction and sharp curettage.  Pt did not show up for her follow up in the office in February, has a large missed ab with small fetal pole She has been using cocaine and Dr Lemont Fillers and I will deem this procedure emergent and will proceed with surgical management to minimize risk of emergent transfusion need  PMH:    Past Medical History:  Diagnosis Date  . Anemia   . Bipolar 1 disorder (HCC)   . Depression   . History of kidney stones   . Panic attacks   . Seizure (HCC) 2019   Unknown etiology; on no meds and has had no more seizures    PSH:     Past Surgical History:  Procedure Laterality Date  . kidney stones      POb/GynH:      OB History    Gravida  7   Para  5   Term  5   Preterm      AB  1   Living  5     SAB      TAB      Ectopic      Multiple      Live Births  5           SH:   Social History   Tobacco Use  . Smoking status: Former Smoker    Packs/day: 1.00    Years: 5.00    Pack years: 5.00    Types: Cigarettes  . Smokeless tobacco: Never Used  Substance Use Topics  . Alcohol use: No  . Drug use: Not Currently    Types: Cocaine    Comment: last use of cocaine 4-5 days ago    FH:    Family History  Problem Relation Age of Onset  . Diabetes Maternal Grandmother   . Cirrhosis Maternal Grandfather   . Cancer Father        prostate  . Drug abuse Mother        overdose  . Autism Son      Allergies: No Known Allergies  Medications:       Current Facility-Administered Medications:  .  ceFAZolin (ANCEF) IVPB 2g/100 mL premix, 2 g, Intravenous, On Call to OR, Lazaro Arms, MD .  ketorolac (TORADOL) 30 MG/ML injection 30 mg, 30 mg, Intravenous, Once, Lazaro Arms, MD  Review of Systems:   Review of Systems  Constitutional: Negative for fever, chills, weight loss, malaise/fatigue and  diaphoresis.  HENT: Negative for hearing loss, ear pain, nosebleeds, congestion, sore throat, neck pain, tinnitus and ear discharge.   Eyes: Negative for blurred vision, double vision, photophobia, pain, discharge and redness.  Respiratory: Negative for cough, hemoptysis, sputum production, shortness of breath, wheezing and stridor.   Cardiovascular: Negative for chest pain, palpitations, orthopnea, claudication, leg swelling and PND.  Gastrointestinal: Positive for abdominal pain. Negative for heartburn, nausea, vomiting, diarrhea, constipation, blood in stool and melena.  Genitourinary: Negative for dysuria, urgency, frequency, hematuria and flank pain.  Musculoskeletal: Negative for myalgias, back pain, joint pain and falls.  Skin: Negative for itching and rash.  Neurological: Negative for dizziness, tingling, tremors, sensory change, speech change, focal weakness, seizures, loss of consciousness, weakness and headaches.  Endo/Heme/Allergies: Negative for environmental allergies and polydipsia. Does not bruise/bleed easily.  Psychiatric/Behavioral: Negative for depression, suicidal ideas, hallucinations, memory loss and substance abuse. The  patient is not nervous/anxious and does not have insomnia.      PHYSICAL EXAM:  Blood pressure 113/67, pulse 69, temperature (!) 97.5 F (36.4 C), temperature source Oral, resp. rate (!) 25, height  (1.651 m), weight 89.8 kg, last menstrual period 06/15/2018, SpO2 97 %.    Vitals reviewed. Constitutional: She is oriented to person, place, and time. She appears well-developed and well-nourished.  HENT:  Head: Normocephalic and atraumatic.  Right Ear: External ear normal.  Left Ear: External ear normal.  Nose: Nose normal.  Mouth/Throat: Oropharynx is clear and moist.  Eyes: Conjunctivae and EOM are normal. Pupils are equal, round, and reactive to light. Right eye exhibits no discharge. Left eye exhibits no discharge. No scleral icterus.  Neck:  Normal range of motion. Neck supple. No tracheal deviation present. No thyromegaly present.  Cardiovascular: Normal rate, regular rhythm, normal heart sounds and intact distal pulses.  Exam reveals no gallop and no friction rub.   No murmur heard. Respiratory: Effort normal and breath sounds normal. No respiratory distress. She has no wheezes. She has no rales. She exhibits no tenderness.  GI: Soft. Bowel sounds are normal. She exhibits no distension and no mass. There is tenderness. There is no rebound and no guarding.  Genitourinary:       Vulva is normal without lesions Vagina is pink moist without discharge Cervix normal in appearance and pap is normal Uterus is normal size, contour, position, consistency, mobility, non-tender Adnexa is negative with normal sized ovaries by sonogram  Musculoskeletal: Normal range of motion. She exhibits no edema and no tenderness.  Neurological: She is alert and oriented to person, place, and time. She has normal reflexes. She displays normal reflexes. No cranial nerve deficit. She exhibits normal muscle tone. Coordination normal.  Skin: Skin is warm and dry. No rash noted. No erythema. No pallor.  Psychiatric: She has a normal mood and affect. Her behavior is normal. Judgment and thought content normal.    Labs: Results for orders placed or performed during the hospital encounter of 10/08/18 (from the past 336 hour(s))  Rapid urine drug screen (hospital performed)   Collection Time: 10/08/18  6:36 AM  Result Value Ref Range   Opiates NONE DETECTED NONE DETECTED   Cocaine POSITIVE (A) NONE DETECTED   Benzodiazepines NONE DETECTED NONE DETECTED   Amphetamines NONE DETECTED NONE DETECTED   Tetrahydrocannabinol NONE DETECTED NONE DETECTED   Barbiturates NONE DETECTED NONE DETECTED  Results for orders placed or performed during the hospital encounter of 10/05/18 (from the past 336 hour(s))  CBC   Collection Time: 10/05/18  2:46 PM  Result Value Ref  Range   WBC 7.1 4.0 - 10.5 K/uL   RBC 3.66 (L) 3.87 - 5.11 MIL/uL   Hemoglobin 12.0 12.0 - 15.0 g/dL   HCT 16.1 (L) 09.6 - 04.5 %   MCV 94.5 80.0 - 100.0 fL   MCH 32.8 26.0 - 34.0 pg   MCHC 34.7 30.0 - 36.0 g/dL   RDW 40.9 81.1 - 91.4 %   Platelets 222 150 - 400 K/uL   nRBC 0.0 0.0 - 0.2 %  Comprehensive metabolic panel   Collection Time: 10/05/18  2:46 PM  Result Value Ref Range   Sodium 135 135 - 145 mmol/L   Potassium 3.5 3.5 - 5.1 mmol/L   Chloride 106 98 - 111 mmol/L   CO2 20 (L) 22 - 32 mmol/L   Glucose, Bld 106 (H) 70 - 99 mg/dL   BUN 13  6 - 20 mg/dL   Creatinine, Ser 1.610.53 0.44 - 1.00 mg/dL   Calcium 8.8 (L) 8.9 - 10.3 mg/dL   Total Protein 7.5 6.5 - 8.1 g/dL   Albumin 3.8 3.5 - 5.0 g/dL   AST 15 15 - 41 U/L   ALT 16 0 - 44 U/L   Alkaline Phosphatase 59 38 - 126 U/L   Total Bilirubin 0.3 0.3 - 1.2 mg/dL   GFR calc non Af Amer >60 >60 mL/min   GFR calc Af Amer >60 >60 mL/min   Anion gap 9 5 - 15  Rapid HIV screen (HIV 1/2 Ab+Ag)   Collection Time: 10/05/18  2:46 PM  Result Value Ref Range   HIV-1 P24 Antigen - HIV24 NON REACTIVE NON REACTIVE   HIV 1/2 Antibodies NON REACTIVE NON REACTIVE   Interpretation (HIV Ag Ab)      A non reactive test result means that HIV 1 or HIV 2 antibodies and HIV 1 p24 antigen were not detected in the specimen.  Urinalysis, Routine w reflex microscopic   Collection Time: 10/05/18  2:46 PM  Result Value Ref Range   Color, Urine YELLOW YELLOW   APPearance HAZY (A) CLEAR   Specific Gravity, Urine 1.019 1.005 - 1.030   pH 7.0 5.0 - 8.0   Glucose, UA NEGATIVE NEGATIVE mg/dL   Hgb urine dipstick NEGATIVE NEGATIVE   Bilirubin Urine NEGATIVE NEGATIVE   Ketones, ur NEGATIVE NEGATIVE mg/dL   Protein, ur NEGATIVE NEGATIVE mg/dL   Nitrite NEGATIVE NEGATIVE   Leukocytes,Ua TRACE (A) NEGATIVE   RBC / HPF 0-5 0 - 5 RBC/hpf   WBC, UA 0-5 0 - 5 WBC/hpf   Bacteria, UA RARE (A) NONE SEEN   Squamous Epithelial / LPF 11-20 0 - 5   Mucus PRESENT     Amorphous Crystal PRESENT   Type and screen   Collection Time: 10/05/18  2:46 PM  Result Value Ref Range   ABO/RH(D) O NEG    Antibody Screen NEG    Sample Expiration      10/08/2018 Performed at [facility], [address] [place], [place] [id number]   Rapid urine drug screen (hospital performed)   Collection Time: 10/05/18  2:59 PM  Result Value Ref Range   Opiates NONE DETECTED NONE DETECTED   Cocaine NONE DETECTED NONE DETECTED   Benzodiazepines NONE DETECTED NONE DETECTED   Amphetamines NONE DETECTED NONE DETECTED   Tetrahydrocannabinol NONE DETECTED NONE DETECTED   Barbiturates NONE DETECTED NONE DETECTED    EKG: Orders placed or performed during the hospital encounter of 08/26/13  . ED EKG  . ED EKG  . EKG 12-Lead  . EKG 12-Lead  . EKG    Imaging Studies: Koreas Ob Transvaginal  Result Date: 10/05/2018 FOLLOW UP SONOGRAM Darcel Smallingracey Alonso is in the office for a follow up sonogram for viability. She is a 38 y.o. year old [id number] with Estimated Date of Delivery: 03/22/19 by LMP now at  3540w0d weeks gestation. Thus far the pregnancy has been complicated by no FHT on prior ultrasound,drug use. GESTATION: SINGLETON FETAL ACTIVITY:          Heart rate         No fht          The fetus is inactive. CERVIX: Appears closed ADNEXA: The ovaries are normal. GESTATIONAL AGE AND  BIOMETRICS: Gestational criteria: Estimated Date of Delivery: 03/22/19 by LMP now at 7540w0d Previous Scans:1 GESTATIONAL SAC  66.2 mm         13 weeks CROWN RUMP LENGTH           11.87 mm         7+2 weeks                                                 AVERAGE EGA(BY THIS SCAN):  7+2 weeks                                             SUSPECTED ABNORMALITIES:  yes,no fht QUALITY OF SCAN: satisfactory TECHNICIAN COMMENTS: [place] 7+2 wks fetal pole,no fht,irregular GS w/mult.cystic areas,GS 66.2 mm=13 wks,crl 11.89 mm,normal ovaries bilat A copy of this report including all images has been saved and backed up to a second  source for retrieval if needed. All measures and details of the anatomical scan, placentation, fluid volume and pelvic anatomy are contained in that report. Amber Flora Lipps 10/05/2018 11:30 AM Clinical Impression and recommendations: I have reviewed the sonogram results above, combined with the patient's current clinical course, below are my impressions and any appropriate recommendations for management based on the sonographic findings. Comparison scan from 08/26/2018 Non viable IUP, missed ab [id number] Estimated Date of Delivery: 03/22/19 Normal general sonographic findings This recommendation follows SRU consensus guidelines: Diagnostic Criteria for Nonviable Pregnancy Early in the First Trimester. Malva Limes Med 2013; [id number]. Lazaro Arms 10/05/2018 11:38 AM      Assessment: Missed ab in the second trimester, diagnosed 6 weeks ago Pt non compliance with care +Cocaine in urine  Plan: Sharp and suction uterine curettage Pt aware she is deemed an emergency in this COVID 19 environment to minimize chance of needing an emergency transfusion in an increasingly resource poor environment Pt aware and desires to proceed   Lazaro Arms 10/08/2018 7:17 AM

## 2018-10-08 NOTE — Anesthesia Preprocedure Evaluation (Addendum)
Anesthesia Evaluation  Patient identified by MRN, date of birth, ID band Patient awake    Reviewed: Allergy & Precautions, NPO status , Patient's Chart, lab work & pertinent test results  Airway Mallampati: I  TM Distance: >3 FB Neck ROM: Full    Dental no notable dental hx. (+) Teeth Intact   Pulmonary neg pulmonary ROS, former smoker,    Pulmonary exam normal breath sounds clear to auscultation       Cardiovascular Exercise Tolerance: Good negative cardio ROS Normal cardiovascular examI Rhythm:Regular Rate:Normal     Neuro/Psych Seizures -, Well Controlled,  Anxiety Depression Bipolar Disorder negative psych ROS   GI/Hepatic negative GI ROS, Neg liver ROS,   Endo/Other  negative endocrine ROS  Renal/GU negative Renal ROS  negative genitourinary   Musculoskeletal negative musculoskeletal ROS (+)   Abdominal   Peds negative pediatric ROS (+)  Hematology negative hematology ROS (+) anemia ,   Anesthesia Other Findings + cocaine in urine  At ~ 20 weeks deemed emergent by Dr. Despina Hidden. D/W pt proceeding at increased risk -WTP  Reproductive/Obstetrics negative OB ROS                             Anesthesia Physical Anesthesia Plan  ASA: III and emergent  Anesthesia Plan: General   Post-op Pain Management:    Induction: Intravenous  PONV Risk Score and Plan:   Airway Management Planned: Oral ETT  Additional Equipment:   Intra-op Plan:   Post-operative Plan: Extubation in OR  Informed Consent: I have reviewed the patients History and Physical, chart, labs and discussed the procedure including the risks, benefits and alternatives for the proposed anesthesia with the patient or authorized representative who has indicated his/her understanding and acceptance.     Dental advisory given  Plan Discussed with: CRNA  Anesthesia Plan Comments: (Due to increased risk of bleeding at this  stage will proceed with GETA  Discussed risks with patient and Dr. Despina Hidden, both agree to proceed with GETA)       Anesthesia Quick Evaluation

## 2018-10-08 NOTE — Anesthesia Procedure Notes (Signed)
Procedure Name: Intubation Date/Time: 10/08/2018 7:52 AM Performed by: Ollen Bowl, CRNA Pre-anesthesia Checklist: Patient identified, Patient being monitored, Timeout performed, Emergency Drugs available and Suction available Patient Re-evaluated:Patient Re-evaluated prior to induction Oxygen Delivery Method: Circle System Utilized Preoxygenation: Pre-oxygenation with 100% oxygen Induction Type: IV induction Ventilation: Mask ventilation without difficulty Laryngoscope Size: Mac and 3 Grade View: Grade II Tube type: Oral Tube size: 7.0 mm Number of attempts: 1 Airway Equipment and Method: stylet Placement Confirmation: ETT inserted through vocal cords under direct vision,  positive ETCO2 and breath sounds checked- equal and bilateral Secured at: 21 cm Tube secured with: Tape Dental Injury: Teeth and Oropharynx as per pre-operative assessment

## 2018-10-11 ENCOUNTER — Other Ambulatory Visit: Payer: Self-pay | Admitting: Obstetrics & Gynecology

## 2018-10-11 ENCOUNTER — Encounter (HOSPITAL_COMMUNITY): Payer: Self-pay | Admitting: Obstetrics & Gynecology

## 2018-10-21 ENCOUNTER — Encounter: Payer: Medicaid Other | Admitting: Obstetrics & Gynecology

## 2018-10-21 ENCOUNTER — Telehealth: Payer: Self-pay | Admitting: *Deleted

## 2018-10-21 NOTE — Telephone Encounter (Signed)
Left message with man for pt to call us back regarding her appt tomorrow.

## 2018-10-22 ENCOUNTER — Encounter: Payer: Medicaid Other | Admitting: Obstetrics & Gynecology

## 2018-10-22 ENCOUNTER — Telehealth: Payer: Self-pay | Admitting: *Deleted

## 2018-10-22 NOTE — Telephone Encounter (Signed)
Patient informed that we are not allowing visitors or children to come to appointments at this time. Patient denies any contact with anyone suspected or confirmed of having COVID-19. Pt denies fever, cough, sob, muscle pain, diarrhea, Lelaina Oatis, vomiting, abdominal pain, red eye, weakness, bruising or bleeding, joint pain or severe headache.  

## 2018-10-25 ENCOUNTER — Encounter: Payer: Medicaid Other | Admitting: Obstetrics & Gynecology

## 2018-10-28 ENCOUNTER — Encounter: Payer: Self-pay | Admitting: *Deleted

## 2018-10-28 ENCOUNTER — Telehealth: Payer: Self-pay | Admitting: *Deleted

## 2018-10-28 NOTE — Telephone Encounter (Signed)

## 2018-11-01 ENCOUNTER — Ambulatory Visit (INDEPENDENT_AMBULATORY_CARE_PROVIDER_SITE_OTHER): Payer: Medicaid Other | Admitting: Obstetrics & Gynecology

## 2018-11-01 ENCOUNTER — Other Ambulatory Visit: Payer: Self-pay

## 2018-11-01 ENCOUNTER — Encounter: Payer: Self-pay | Admitting: Obstetrics & Gynecology

## 2018-11-01 VITALS — BP 117/78 | HR 61 | Ht 65.0 in | Wt 194.0 lb

## 2018-11-01 DIAGNOSIS — O021 Missed abortion: Secondary | ICD-10-CM

## 2018-11-01 DIAGNOSIS — Z9889 Other specified postprocedural states: Secondary | ICD-10-CM

## 2018-11-01 MED ORDER — DESOGESTREL-ETHINYL ESTRADIOL 0.15-30 MG-MCG PO TABS: 1.0000 | ORAL_TABLET | Freq: Every day | ORAL | 11 refills | Status: DC

## 2018-11-01 NOTE — Progress Notes (Signed)
  HPI: Patient returns for routine postoperative follow-up having undergone D&C on 10/11/2018.  The patient's immediate postoperative recovery has been unremarkable. Since hospital discharge the patient reports no problems.   Current Outpatient Medications: busPIRone (BUSPAR) 10 MG tablet, Take 10 mg by mouth 2 (two) times daily., Disp: , Rfl:   No current facility-administered medications for this visit.     Blood pressure 117/78, pulse 61, height 5\' 5"  (1.651 m), weight 194 lb (88 kg), last menstrual period 06/15/2018, unknown if currently breastfeeding.  Physical Exam: Normal exam post D&C  Diagnostic Tests:   Pathology: benign  Impression: S/p D&C for missed AB  Plan:   Follow up: prn     Meds ordered this encounter  Medications  . desogestrel-ethinyl estradiol (APRI,EMOQUETTE,SOLIA) 0.15-30 MG-MCG tablet    Sig: Take 1 tablet by mouth daily.    Dispense:  1 Package    Refill:  11    Lazaro Arms, MD

## 2019-07-22 NOTE — L&D Delivery Note (Signed)
Delivery Note At 11:24 AM a viable female was delivered via Vaginal, Spontaneous (Presentation: Right Occiput Anterior).  APGAR: 8, 9; weight  .   Placenta status: Spontaneous;Expressed, Intact.  Cord: 3 vessels with the following complications: None.  Cord pH: NA  Anesthesia: Epidural Episiotomy: None Lacerations: Perineal, hemostatic Suture Repair: none Est. Blood Loss (mL): 75  Mom to postpartum.  Baby to Couplet care / Skin to Skin.  Kelli Mullins 02/04/2020, 12:31 PM  Kelli Mullins is a 39 y.o. female [phone] with IUP at [redacted]w[redacted]d admitted for PROM at term .  She progressed with augmentation to complete and pushed less than 20 minutes to deliver.  Cord clamping delayed by 1+ minutes then clamped by CNM and cut by FOB.  Placenta intact and spontaneous, bleeding minimal.  Tiny first degree laceration hemostatic and not repaired. Mom and baby stable prior to transfer to postpartum. She plans on breastfeeding. She requests BTL for birth control.  Midwife attestation: I was gloved and present for delivery in its entirety and I agree with the above resident's note.  Sharen Counter, CNM 10:06 AM

## 2019-09-22 ENCOUNTER — Telehealth: Payer: Self-pay | Admitting: Obstetrics & Gynecology

## 2019-09-22 NOTE — Telephone Encounter (Signed)

## 2019-09-26 ENCOUNTER — Encounter: Payer: Self-pay | Admitting: *Deleted

## 2019-09-26 ENCOUNTER — Other Ambulatory Visit: Payer: Self-pay

## 2019-09-26 ENCOUNTER — Ambulatory Visit (INDEPENDENT_AMBULATORY_CARE_PROVIDER_SITE_OTHER): Payer: Medicaid Other | Admitting: *Deleted

## 2019-09-26 VITALS — BP 114/74 | HR 63 | Ht 65.0 in | Wt 202.0 lb

## 2019-09-26 DIAGNOSIS — Z3201 Encounter for pregnancy test, result positive: Secondary | ICD-10-CM

## 2019-09-26 LAB — POCT URINE PREGNANCY: Preg Test, Ur: POSITIVE — AB

## 2019-09-26 MED ORDER — PRENATAL PLUS 27-1 MG PO TABS: 1.0000 | ORAL_TABLET | Freq: Every day | ORAL | 12 refills | Status: DC

## 2019-09-26 MED ORDER — PROMETHAZINE HCL 25 MG PO TABS: 25.0000 mg | ORAL_TABLET | Freq: Four times a day (QID) | ORAL | 1 refills | Status: DC | PRN

## 2019-09-26 NOTE — Progress Notes (Signed)
Chart reviewed for nurse visit. Agree with plan of care. Will rx prenatal plus and phenergan Adline Potter, NP 09/26/2019 12:09 PM

## 2019-09-26 NOTE — Progress Notes (Signed)
   NURSE VISIT- PREGNANCY CONFIRMATION   SUBJECTIVE:  Kelli Mullins is a 39 y.o. [id number] female at [redacted]w[redacted]d by uncertain LMP of Patient's last menstrual period was 06/18/2019 (approximate). Here for pregnancy confirmation.  Home pregnancy test: positive x 3  She reports nausea.  She is not taking prenatal vitamins.    OBJECTIVE:  BP 114/74 (BP Location: Left Arm, Patient Position: Sitting, Cuff Size: Large)   Pulse 63   Ht 5\' 5"  (1.651 m)   Wt 202 lb (91.6 kg)   LMP 06/18/2019 (Approximate)   Breastfeeding No   BMI 33.61 kg/m   Appears well, in no apparent distress OB History  Gravida Para Term Preterm AB Living  8 5 5   1 5   SAB TAB Ectopic Multiple Live Births          5    # Outcome Date GA Lbr Len/2nd Weight Sex Delivery Anes PTL Lv  8 Current           7 AB 2007          6 Term     M Vag-Spont   LIV  5 Term     M Vag-Spont   LIV  4 Term     F Vag-Spont   LIV  3 Term     F Vag-Spont   LIV  2 Term     M Vag-Spont   LIV  1 Gravida             Results for orders placed or performed in visit on 09/26/19 (from the past 24 hour(s))  POCT urine pregnancy   Collection Time: 09/26/19 11:53 AM  Result Value Ref Range   Preg Test, Ur Positive (A) Negative    ASSESSMENT: Positive pregnancy test, [redacted]w[redacted]d by LMP    PLAN: Schedule for dating ultrasound in 2 weeks Prenatal vitamins: note routed to [facility] to send prescription   Nausea medicines: requested-note routed to [facility] to send prescription   OB packet given: Yes  Joesphine Schemm  09/26/2019 11:57 AM

## 2019-09-26 NOTE — Addendum Note (Signed)
Addended by: Cyril Mourning A on: 09/26/2019 12:10 PM   Modules accepted: Orders

## 2019-09-29 ENCOUNTER — Telehealth: Payer: Self-pay | Admitting: *Deleted

## 2019-09-29 NOTE — Telephone Encounter (Signed)
Pt left message that she needs a prescription for constipation. Pt advised to use the items listed in new ob packet.

## 2019-10-06 ENCOUNTER — Telehealth: Payer: Self-pay | Admitting: Obstetrics & Gynecology

## 2019-10-06 NOTE — Telephone Encounter (Signed)

## 2019-10-07 ENCOUNTER — Other Ambulatory Visit: Payer: Self-pay | Admitting: Obstetrics and Gynecology

## 2019-10-07 DIAGNOSIS — O3680X0 Pregnancy with inconclusive fetal viability, not applicable or unspecified: Secondary | ICD-10-CM

## 2019-10-10 ENCOUNTER — Other Ambulatory Visit: Payer: Medicaid Other

## 2019-10-19 ENCOUNTER — Telehealth: Payer: Self-pay | Admitting: Obstetrics & Gynecology

## 2019-10-19 NOTE — Telephone Encounter (Signed)

## 2019-10-24 ENCOUNTER — Other Ambulatory Visit: Payer: Medicaid Other

## 2019-10-24 ENCOUNTER — Other Ambulatory Visit: Payer: Self-pay

## 2019-10-24 ENCOUNTER — Other Ambulatory Visit: Payer: Self-pay | Admitting: Obstetrics and Gynecology

## 2019-10-24 ENCOUNTER — Ambulatory Visit (INDEPENDENT_AMBULATORY_CARE_PROVIDER_SITE_OTHER): Payer: Medicaid Other

## 2019-10-24 DIAGNOSIS — Z363 Encounter for antenatal screening for malformations: Secondary | ICD-10-CM

## 2019-10-24 DIAGNOSIS — O321XX0 Maternal care for breech presentation, not applicable or unspecified: Secondary | ICD-10-CM

## 2019-10-24 DIAGNOSIS — O0932 Supervision of pregnancy with insufficient antenatal care, second trimester: Secondary | ICD-10-CM

## 2019-10-24 DIAGNOSIS — O3680X0 Pregnancy with inconclusive fetal viability, not applicable or unspecified: Secondary | ICD-10-CM

## 2019-10-24 DIAGNOSIS — Z3A26 26 weeks gestation of pregnancy: Secondary | ICD-10-CM

## 2019-10-24 DIAGNOSIS — Z8659 Personal history of other mental and behavioral disorders: Secondary | ICD-10-CM

## 2019-10-24 NOTE — Progress Notes (Signed)
[place] 26+1 wks,breech,anterior placenta gr 0,normal ovaries,cx 4.1 cm,afi 15.8 cm,fhr 166 bpm,efw 888 g,anatomy complete,no obvious abnormalities,EDD 01/29/2020 by today's ultrasound

## 2019-11-07 ENCOUNTER — Other Ambulatory Visit: Payer: Self-pay

## 2019-11-07 ENCOUNTER — Encounter: Payer: Self-pay | Admitting: Women's Health

## 2019-11-07 ENCOUNTER — Ambulatory Visit: Payer: Medicaid Other | Admitting: *Deleted

## 2019-11-07 ENCOUNTER — Other Ambulatory Visit (HOSPITAL_COMMUNITY)
Admission: RE | Admit: 2019-11-07 | Discharge: 2019-11-07 | Disposition: A | Discharge status: Home / Self Care | Payer: Medicaid Other | Source: Ambulatory Visit | Attending: Obstetrics & Gynecology | Admitting: Obstetrics & Gynecology

## 2019-11-07 ENCOUNTER — Ambulatory Visit (INDEPENDENT_AMBULATORY_CARE_PROVIDER_SITE_OTHER): Payer: Medicaid Other | Admitting: Women's Health

## 2019-11-07 VITALS — BP 121/76 | HR 83 | Wt 209.0 lb

## 2019-11-07 DIAGNOSIS — Z3483 Encounter for supervision of other normal pregnancy, third trimester: Secondary | ICD-10-CM

## 2019-11-07 DIAGNOSIS — Z3A28 28 weeks gestation of pregnancy: Secondary | ICD-10-CM

## 2019-11-07 DIAGNOSIS — Z3480 Encounter for supervision of other normal pregnancy, unspecified trimester: Secondary | ICD-10-CM | POA: Diagnosis not present

## 2019-11-07 DIAGNOSIS — F418 Other specified anxiety disorders: Secondary | ICD-10-CM | POA: Insufficient documentation

## 2019-11-07 DIAGNOSIS — O0933 Supervision of pregnancy with insufficient antenatal care, third trimester: Secondary | ICD-10-CM

## 2019-11-07 DIAGNOSIS — Z8669 Personal history of other diseases of the nervous system and sense organs: Secondary | ICD-10-CM

## 2019-11-07 DIAGNOSIS — Z23 Encounter for immunization: Secondary | ICD-10-CM

## 2019-11-07 DIAGNOSIS — O0932 Supervision of pregnancy with insufficient antenatal care, second trimester: Secondary | ICD-10-CM | POA: Insufficient documentation

## 2019-11-07 LAB — POCT URINALYSIS DIPSTICK OB
Blood, UA: NEGATIVE
Glucose, UA: NEGATIVE
Ketones, UA: NEGATIVE
Leukocytes, UA: NEGATIVE
Nitrite, UA: NEGATIVE
POC,PROTEIN,UA: NEGATIVE

## 2019-11-07 MED ORDER — BLOOD PRESSURE MONITOR MISC: 0 refills | Status: AC

## 2019-11-07 NOTE — Patient Instructions (Signed)
Kelli Mullins, I greatly value your feedback.  If you receive a survey following your visit with [place] today, we appreciate you taking the time to fill it out.  Thanks, Kelli Mullins, CNM, WHNP-BC   [facility] ([address] [place], [place] [address]) Entrance C, located off of [address] valet parking  Go to [email or web address] to register for FREE online childbirth classes   Call the office [phone]) or go to [facility] if:  You begin to have strong, frequent contractions  Your water breaks.  Sometimes it is a big gush of fluid, sometimes it is just a trickle that keeps getting your panties wet or running down your legs  You have vaginal bleeding.  It is normal to have a small amount of spotting if your cervix was checked.   You don't feel your baby moving like normal.  If you don't, get you something to eat and drink and lay down and focus on feeling your baby move.  You should feel at least 10 movements in 2 hours.  If you don't, you should call the office or go to [facility].    Tdap Vaccine  It is recommended that you get the Tdap vaccine during the third trimester of EACH pregnancy to help protect your baby from getting pertussis (whooping cough)  27-36 weeks is the BEST time to do this so that you can pass the protection on to your baby. During pregnancy is better than after pregnancy, but if you are unable to get it during pregnancy it will be offered at the hospital.   You can get this vaccine with [place], at the health department, your family doctor, or some local pharmacies  Everyone who will be around your baby should also be up-to-date on their vaccines before the baby comes. Adults (who are not pregnant) only need 1 dose of Tdap during adulthood.   [place] Pediatricians/Family Doctors:  [facility] [phone]            [facility] [phone]                 [facility]  [phone] (usually not accepting new patients unless you have family there already, you are always welcome to call and ask)       [facility] Department [phone]       [facility] Pediatricians/Family Doctors:   [facility]: [phone]  [facility]: [phone]  [facility]: [phone]  [facility] Doctors:   [facility]: [phone]   [facility]: [phone]  [facility] Doctors:  [facility]: [phone]   Home Blood Pressure Monitoring for Patients   Your provider has recommended that you check your blood pressure (BP) at least once a week at home. If you do not have a blood pressure cuff at home, one will be provided for you. Contact your provider if you have not received your monitor within 1 week.   Helpful Tips for Accurate Home Blood Pressure Checks  . Don't smoke, exercise, or drink caffeine 30 minutes before checking your BP . Use the restroom before checking your BP (a full bladder can raise your pressure) . Relax in a comfortable upright chair . Feet on the ground . Left arm resting comfortably on a flat surface at the level of your heart . Legs uncrossed . Back supported . Sit quietly and don't talk . Place the cuff on your bare  arm . Adjust snuggly, so that only two fingertips can fit between your skin and the top of the cuff . Check 2 readings separated by at least one minute . Keep a log of your BP readings . For a visual, please reference this diagram: http://ccnc.care/bpdiagram  Provider Name: [facility]     Phone: [phone]  Zone 1: ALL CLEAR  Continue to monitor your symptoms:  . BP reading is less than 140 (top number) or less than 90 (bottom number)  . No right upper stomach pain . No headaches or seeing spots . No feeling nauseated or throwing up . No swelling in face and hands  Zone 2: CAUTION Call your  doctor's office for any of the following:  . BP reading is greater than 140 (top number) or greater than 90 (bottom number)  . Stomach pain under your ribs in the middle or right side . Headaches or seeing spots . Feeling nauseated or throwing up . Swelling in face and hands  Zone 3: EMERGENCY  Seek immediate medical care if you have any of the following:  . BP reading is greater than160 (top number) or greater than 110 (bottom number) . Severe headaches not improving with Tylenol . Serious difficulty catching your breath . Any worsening symptoms from Zone 2   Third Trimester of Pregnancy The third trimester is from week 29 through week 42, months 7 through 9. The third trimester is a time when the fetus is growing rapidly. At the end of the ninth month, the fetus is about 20 inches in length and weighs 6-10 pounds.  BODY CHANGES Your body goes through many changes during pregnancy. The changes vary from woman to woman.   Your weight will continue to increase. You can expect to gain 25-35 pounds (11-16 kg) by the end of the pregnancy.  You may begin to get stretch marks on your hips, abdomen, and breasts.  You may urinate more often because the fetus is moving lower into your pelvis and pressing on your bladder.  You may develop or continue to have heartburn as a result of your pregnancy.  You may develop constipation because certain hormones are causing the muscles that push waste through your intestines to slow down.  You may develop hemorrhoids or swollen, bulging veins (varicose veins).  You may have pelvic pain because of the weight gain and pregnancy hormones relaxing your joints between the bones in your pelvis. Backaches may result from overexertion of the muscles supporting your posture.  You may have changes in your hair. These can include thickening of your hair, rapid growth, and changes in texture. Some women also have hair loss during or after pregnancy, or hair that  feels dry or thin. Your hair will most likely return to normal after your baby is born.  Your breasts will continue to grow and be tender. A yellow discharge may leak from your breasts called colostrum.  Your belly button may stick out.  You may feel short of breath because of your expanding uterus.  You may notice the fetus "dropping," or moving lower in your abdomen.  You may have a bloody mucus discharge. This usually occurs a few days to a week before labor begins.  Your cervix becomes thin and soft (effaced) near your due date. WHAT TO EXPECT AT YOUR PRENATAL EXAMS  You will have prenatal exams every 2 weeks until week 36. Then, you will have weekly prenatal exams. During a routine prenatal visit:  You  will be weighed to make sure you and the fetus are growing normally.  Your blood pressure is taken.  Your abdomen will be measured to track your baby's growth.  The fetal heartbeat will be listened to.  Any test results from the previous visit will be discussed.  You may have a cervical check near your due date to see if you have effaced. At around 36 weeks, your caregiver will check your cervix. At the same time, your caregiver will also perform a test on the secretions of the vaginal tissue. This test is to determine if a type of bacteria, Group B streptococcus, is present. Your caregiver will explain this further. Your caregiver may ask you:  What your birth plan is.  How you are feeling.  If you are feeling the baby move.  If you have had any abnormal symptoms, such as leaking fluid, bleeding, severe headaches, or abdominal cramping.  If you have any questions. Other tests or screenings that may be performed during your third trimester include:  Blood tests that check for low iron levels (anemia).  Fetal testing to check the health, activity level, and growth of the fetus. Testing is done if you have certain medical conditions or if there are problems during the  pregnancy. FALSE LABOR You may feel small, irregular contractions that eventually go away. These are called Braxton Hicks contractions, or false labor. Contractions may last for hours, days, or even weeks before true labor sets in. If contractions come at regular intervals, intensify, or become painful, it is best to be seen by your caregiver.  SIGNS OF LABOR   Menstrual-like cramps.  Contractions that are 5 minutes apart or less.  Contractions that start on the top of the uterus and spread down to the lower abdomen and back.  A sense of increased pelvic pressure or back pain.  A watery or bloody mucus discharge that comes from the vagina. If you have any of these signs before the 37th week of pregnancy, call your caregiver right away. You need to go to the hospital to get checked immediately. HOME CARE INSTRUCTIONS   Avoid all smoking, herbs, alcohol, and unprescribed drugs. These chemicals affect the formation and growth of the baby.  Follow your caregiver's instructions regarding medicine use. There are medicines that are either safe or unsafe to take during pregnancy.  Exercise only as directed by your caregiver. Experiencing uterine cramps is a good sign to stop exercising.  Continue to eat regular, healthy meals.  Wear a good support bra for breast tenderness.  Do not use hot tubs, steam rooms, or saunas.  Wear your seat belt at all times when driving.  Avoid raw meat, uncooked cheese, cat litter boxes, and soil used by cats. These carry germs that can cause birth defects in the baby.  Take your prenatal vitamins.  Try taking a stool softener (if your caregiver approves) if you develop constipation. Eat more high-fiber foods, such as fresh vegetables or fruit and whole grains. Drink plenty of fluids to keep your urine clear or pale yellow.  Take warm sitz baths to soothe any pain or discomfort caused by hemorrhoids. Use hemorrhoid cream if your caregiver approves.  If you  develop varicose veins, wear support hose. Elevate your feet for 15 minutes, 3-4 times a day. Limit salt in your diet.  Avoid heavy lifting, wear low heal shoes, and practice good posture.  Rest a lot with your legs elevated if you have leg cramps or low back  pain.  Visit your dentist if you have not gone during your pregnancy. Use a soft toothbrush to brush your teeth and be gentle when you floss.  A sexual relationship may be continued unless your caregiver directs you otherwise.  Do not travel far distances unless it is absolutely necessary and only with the approval of your caregiver.  Take prenatal classes to understand, practice, and ask questions about the labor and delivery.  Make a trial run to the hospital.  Pack your hospital bag.  Prepare the baby's nursery.  Continue to go to all your prenatal visits as directed by your caregiver. SEEK MEDICAL CARE IF:  You are unsure if you are in labor or if your water has broken.  You have dizziness.  You have mild pelvic cramps, pelvic pressure, or nagging pain in your abdominal area.  You have persistent nausea, vomiting, or diarrhea.  You have a bad smelling vaginal discharge.  You have pain with urination. SEEK IMMEDIATE MEDICAL CARE IF:   You have a fever.  You are leaking fluid from your vagina.  You have spotting or bleeding from your vagina.  You have severe abdominal cramping or pain.  You have rapid weight loss or gain.  You have shortness of breath with chest pain.  You notice sudden or extreme swelling of your face, hands, ankles, feet, or legs.  You have not felt your baby move in over an hour.  You have severe headaches that do not go away with medicine.  You have vision changes. Document Released: 07/01/2001 Document Revised: 07/12/2013 Document Reviewed: 09/07/2012 [facility] Patient Information 2015 [place], [place]. This information is not intended to replace advice given to you by your health  care provider. Make sure you discuss any questions you have with your health care provider.  PROTECT YOURSELF & YOUR BABY FROM THE FLU! Because you are pregnant, we at [facility], along with the Centers for Disease Control (CDC), recommend that you receive the flu vaccine to protect yourself and your baby from the flu. The flu is more likely to cause severe illness in pregnant women than in women of reproductive age who are not pregnant. Changes in the immune system, heart, and lungs during pregnancy make pregnant women (and women up to two weeks postpartum) more prone to severe illness from flu, including illness resulting in hospitalization. Flu also may be harmful for a pregnant woman's developing baby. A common flu symptom is fever, which may be associated with neural tube defects and other adverse outcomes for a developing baby. Getting vaccinated can also help protect a baby after birth from flu. (Mom passes antibodies onto the developing baby during her pregnancy.)  A Flu Vaccine is the Best Protection Against Flu Getting a flu vaccine is the first and most important step in protecting against flu. Pregnant women should get a flu shot and not the live attenuated influenza vaccine (LAIV), also known as nasal spray flu vaccine. Flu vaccines given during pregnancy help protect both the mother and her baby from flu. Vaccination has been shown to reduce the risk of flu-associated acute respiratory infection in pregnant women by up to one-half. A 2018 study showed that getting a flu shot reduced a pregnant woman's risk of being hospitalized with flu by an average of 40 percent. Pregnant women who get a flu vaccine are also helping to protect their babies from flu illness for the first several months after their birth, when they are too [place] to get vaccinated.  A Long Record of Safety for Flu Shots in Pregnant Women Flu shots have been given to millions of pregnant women over many years with a good  safety record. There is a lot of evidence that flu vaccines can be given safely during pregnancy; though these data are limited for the first trimester. The CDC recommends that pregnant women get vaccinated during any trimester of their pregnancy. It is very important for pregnant women to get the flu shot.   Other Preventive Actions In addition to getting a flu shot, pregnant women should take the same everyday preventive actions the CDC recommends of everyone, including covering coughs, washing hands often, and avoiding people who are sick.  Symptoms and Treatment If you get sick with flu symptoms call your doctor right away. There are antiviral drugs that can treat flu illness and prevent serious flu complications. The CDC recommends prompt treatment for people who have influenza infection or suspected influenza infection and who are at high risk of serious flu complications, such as people with asthma, diabetes (including gestational diabetes), or heart disease. Early treatment of influenza in hospitalized pregnant women has been shown to reduce the length of the hospital stay.  Symptoms Flu symptoms include fever, cough, sore throat, runny or stuffy nose, body aches, headache, chills and fatigue. Some people may also have vomiting and diarrhea. People may be infected with the flu and have respiratory symptoms without a fever.  Early Treatment is Important for Pregnant Women Treatment should begin as soon as possible because antiviral drugs work best when started early (within 48 hours after symptoms start). Antiviral drugs can make your flu illness milder and make you feel better faster. They may also prevent serious health problems that can result from flu illness. Oral oseltamivir (Tamiflu) is the preferred treatment for pregnant women because it has the most studies available to suggest that it is safe and beneficial. Antiviral drugs require a prescription from your provider. Having a fever  caused by flu infection or other infections early in pregnancy may be linked to birth defects in a baby. In addition to taking antiviral drugs, pregnant women who get a fever should treat their fever with Tylenol (acetaminophen) and contact their provider immediately.  When to [place] If you are pregnant and have any of these signs, seek care immediately:  Difficulty breathing or shortness of breath  Pain or pressure in the chest or abdomen  Sudden dizziness  Confusion  Severe or persistent vomiting  High fever that is not responding to Tylenol (or store brand equivalent)  Decreased or no movement of your baby  [email or web address]

## 2019-11-07 NOTE — Progress Notes (Signed)
INITIAL OBSTETRICAL VISIT Patient name: Kelli Mullins MRN [id number]  Date of birth: 05-29-1981 Chief Complaint:   Initial Prenatal Visit  History of Present Illness:   Kelli Mullins is a 39 y.o. G72P5015 Caucasian female at [redacted]w[redacted]d by 26wk u/s, with an Estimated Date of Delivery: 01/29/20 being seen today for her initial obstetrical visit.   Her obstetrical history is significant for term uncomplicated SVB x5, SAB x1.   Today she reports no complaints.  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 Patient is pregnant. Last pap 72yrs ago. Results were: normal Review of Systems:   Pertinent items are noted in HPI Denies cramping/contractions, leakage of fluid, vaginal bleeding, abnormal vaginal discharge w/ itching/odor/irritation, headaches, visual changes, shortness of breath, chest pain, abdominal pain, severe nausea/vomiting, or problems with urination or bowel movements unless otherwise stated above.  Pertinent History Reviewed:  Reviewed past medical,surgical, social, obstetrical and family history.  Reviewed problem list, medications and allergies. OB History  Gravida Para Term Preterm AB Living  7 5 5   1 5   SAB TAB Ectopic Multiple Live Births  1       5    # Outcome Date GA Lbr Len/2nd Weight Sex Delivery Anes PTL Lv  7 Current           6 SAB 2020          5 Term 06/06/10 [redacted]w[redacted]d  9 lb 2 oz (4.139 kg) M Vag-Spont None N LIV  4 Term 05/27/06 [redacted]w[redacted]d  7 lb 2 oz (3.232 kg) M Vag-Spont EPI N LIV  3 Term 05/17/04 [redacted]w[redacted]d  8 lb (3.629 kg) F Vag-Spont EPI N LIV  2  Term 09/29/00 [redacted]w[redacted]d  8 lb 11 oz (3.941 kg) F Vag-Spont EPI  LIV  1 Term 01/02/99 [redacted]w[redacted]d  7 lb 3 oz (3.26 kg) M Vag-Spont EPI N LIV   Physical Assessment:   Vitals:   11/07/19 1451  BP: 121/76  Pulse: 83  Weight: 209 lb (94.8 kg)  Body mass index is 34.78 kg/m.       Physical Examination:  General appearance - well appearing, and in no distress  Mental status - alert, oriented to person, place, and time  Psych:  She has a normal mood and affect  Skin - warm and dry, normal color, no suspicious lesions noted  Chest - effort normal, all lung fields clear to auscultation bilaterally  Heart - normal rate and regular rhythm  Abdomen - soft, nontender  Extremities:  No swelling or varicosities noted  Pelvic - VULVA: normal appearing vulva with no masses, tenderness or lesions  VAGINA: normal appearing vagina with normal color and discharge, no lesions  CERVIX: normal appearing cervix without discharge or lesions, no CMT  Thin prep pap is done w/ HR HPV cotesting  TODAY'S FH: 28cm FHT: 149  Results for orders placed or performed in visit on 11/07/19 (from the past 24 hour(s))  POC Urinalysis Dipstick OB   Collection Time:  11/07/19  3:29 PM  Result Value Ref Range   Color, UA     Clarity, UA     Glucose, UA Negative Negative   Bilirubin, UA     Ketones, UA neg    Spec Grav, UA     Blood, UA neg    pH, UA     POC,PROTEIN,UA Negative Negative, Trace, Small (1+), Moderate (2+), Large (3+), 4+   Urobilinogen, UA     Nitrite, UA neg    Leukocytes, UA Negative Negative   Appearance     Odor      Assessment & Plan:  1) Low-Risk Pregnancy [id number] at [redacted]w[redacted]d with an Estimated Date of Delivery: 01/29/20   2) Initial OB visit  3) H/O epilepsy> no meds, last seizure 37yrs ago  4) Dep/anx> no meds in 14mths doing well  5) Grand multip  6) Late prenatal care  Meds:  Meds ordered this encounter  Medications  . Blood Pressure Monitor MISC    Sig: For regular home bp monitoring during  pregnancy    Dispense:  1 each    Refill:  0    Z34.80    Initial labs obtained Continue prenatal vitamins Reviewed n/v relief measures and warning s/s to report Reviewed recommended weight gain based on pre-gravid BMI Encouraged well-balanced diet Genetic & carrier screening discussed: requests  Ultrasound discussed; fetal survey: results reviewed [place] completed> form faxed if has or is planning to apply for medicaid The nature of [place] for [employer] with multiple MDs and other Advanced Practice Providers was explained to patient; also emphasized that fellows, residents, and students are part of our team. Does not have home bp cuff. Rx faxed to [facility]. Check bp weekly, let us know if >140/90.  tdap today  Follow-up: Return for ASAP sugar test (no visit), then 4wks LROB/CNM in person.   Orders Placed This Encounter  Procedures  . Urine Culture  . Tdap vaccine greater than or equal to 7yo IM  . Pain Management Screening Profile (10S)  . Obstetric Panel, Including HIV  . Hepatitis C antibody  . Hgb Fractionation Cascade  . Genetic Screening  . POC Urinalysis Dipstick OB    Roma Schanz CNM, [facility] 11/07/2019 3:44 PM

## 2019-11-08 ENCOUNTER — Encounter: Payer: Self-pay | Admitting: Women's Health

## 2019-11-08 DIAGNOSIS — O09899 Supervision of other high risk pregnancies, unspecified trimester: Secondary | ICD-10-CM | POA: Insufficient documentation

## 2019-11-08 DIAGNOSIS — Z283 Underimmunization status: Secondary | ICD-10-CM | POA: Insufficient documentation

## 2019-11-08 DIAGNOSIS — B1920 Unspecified viral hepatitis C without hepatic coma: Secondary | ICD-10-CM | POA: Insufficient documentation

## 2019-11-08 LAB — PMP SCREEN PROFILE (10S), URINE
Amphetamine Scrn, Ur: NEGATIVE ng/mL
BARBITURATE SCREEN URINE: NEGATIVE ng/mL
BENZODIAZEPINE SCREEN, URINE: NEGATIVE ng/mL
CANNABINOIDS UR QL SCN: NEGATIVE ng/mL
Cocaine (Metab) Scrn, Ur: NEGATIVE ng/mL
Creatinine(Crt), U: 152.5 mg/dL (ref 20.0–300.0)
Methadone Screen, Urine: NEGATIVE ng/mL
OXYCODONE+OXYMORPHONE UR QL SCN: NEGATIVE ng/mL
Opiate Scrn, Ur: NEGATIVE ng/mL
Ph of Urine: 5.5 (ref 4.5–8.9)
Phencyclidine Qn, Ur: NEGATIVE ng/mL
Propoxyphene Scrn, Ur: NEGATIVE ng/mL

## 2019-11-09 LAB — OBSTETRIC PANEL, INCLUDING HIV
Antibody Screen: NEGATIVE
Basophils Absolute: 0 10*3/uL (ref 0.0–0.2)
Basos: 0 %
EOS (ABSOLUTE): 0.1 10*3/uL (ref 0.0–0.4)
Eos: 1 %
HIV Screen 4th Generation wRfx: NONREACTIVE
Hematocrit: 34 % (ref 34.0–46.6)
Hemoglobin: 11.7 g/dL (ref 11.1–15.9)
Hepatitis B Surface Ag: NEGATIVE
Immature Grans (Abs): 0.1 10*3/uL (ref 0.0–0.1)
Immature Granulocytes: 1 %
Lymphocytes Absolute: 2.3 10*3/uL (ref 0.7–3.1)
Lymphs: 25 %
MCH: 33.1 pg — ABNORMAL HIGH (ref 26.6–33.0)
MCHC: 34.4 g/dL (ref 31.5–35.7)
MCV: 96 fL (ref 79–97)
Monocytes Absolute: 0.5 10*3/uL (ref 0.1–0.9)
Monocytes: 6 %
Neutrophils Absolute: 6.2 10*3/uL (ref 1.4–7.0)
Neutrophils: 67 %
Platelets: 200 10*3/uL (ref 150–450)
RBC: 3.53 x10E6/uL — ABNORMAL LOW (ref 3.77–5.28)
RDW: 12.9 % (ref 11.7–15.4)
RPR Ser Ql: NONREACTIVE
Rh Factor: NEGATIVE
Rubella Antibodies, IGG: <0.9 index — ABNORMAL LOW (ref >0.99)
WBC: 9.3 10*3/uL (ref 3.4–10.8)

## 2019-11-09 LAB — HGB FRACTIONATION CASCADE
Hgb A2: 2.5 % (ref 1.8–3.2)
Hgb A: 95.1 % — ABNORMAL LOW (ref 96.4–98.8)
Hgb F: 2.4 % — ABNORMAL HIGH (ref 0.0–2.0)
Hgb S: 0 %

## 2019-11-09 LAB — HEPATITIS C ANTIBODY: Hep C Virus Ab: >11 s/co ratio — ABNORMAL HIGH (ref 0.0–0.9)

## 2019-11-10 ENCOUNTER — Other Ambulatory Visit: Payer: Self-pay

## 2019-11-10 ENCOUNTER — Other Ambulatory Visit: Payer: Medicaid Other

## 2019-11-10 DIAGNOSIS — B1920 Unspecified viral hepatitis C without hepatic coma: Secondary | ICD-10-CM

## 2019-11-10 DIAGNOSIS — Z131 Encounter for screening for diabetes mellitus: Secondary | ICD-10-CM

## 2019-11-10 DIAGNOSIS — Z3480 Encounter for supervision of other normal pregnancy, unspecified trimester: Secondary | ICD-10-CM

## 2019-11-10 LAB — CYTOLOGY - PAP
Chlamydia: NEGATIVE
Comment: NEGATIVE
Comment: NEGATIVE
Comment: NORMAL
Diagnosis: HIGH — AB
High risk HPV: NEGATIVE
Neisseria Gonorrhea: NEGATIVE

## 2019-11-11 LAB — COMPREHENSIVE METABOLIC PANEL
ALT: 9 IU/L (ref 0–32)
AST: 14 IU/L (ref 0–40)
Albumin/Globulin Ratio: 1.2 (ref 1.2–2.2)
Albumin: 3.7 g/dL — ABNORMAL LOW (ref 3.8–4.8)
Alkaline Phosphatase: 80 IU/L (ref 39–117)
BUN/Creatinine Ratio: 13 (ref 9–23)
BUN: 7 mg/dL (ref 6–20)
Bilirubin Total: 0.2 mg/dL (ref 0.0–1.2)
CO2: 20 mmol/L (ref 20–29)
Calcium: 9 mg/dL (ref 8.7–10.2)
Chloride: 105 mmol/L (ref 96–106)
Creatinine, Ser: 0.53 mg/dL — ABNORMAL LOW (ref 0.57–1.00)
GFR calc Af Amer: 141 mL/min/{1.73_m2} (ref >59)
GFR calc non Af Amer: 123 mL/min/{1.73_m2} (ref >59)
Globulin, Total: 3.1 g/dL (ref 1.5–4.5)
Glucose: 82 mg/dL (ref 65–99)
Potassium: 3.7 mmol/L (ref 3.5–5.2)
Sodium: 138 mmol/L (ref 134–144)
Total Protein: 6.8 g/dL (ref 6.0–8.5)

## 2019-11-11 LAB — GLUCOSE TOLERANCE, 2 HOURS W/ 1HR
Glucose, 1 hour: 75 mg/dL (ref 65–179)
Glucose, 2 hour: 57 mg/dL — ABNORMAL LOW (ref 65–152)
Glucose, Fasting: 83 mg/dL (ref 65–91)

## 2019-11-14 ENCOUNTER — Encounter: Payer: Self-pay | Admitting: Women's Health

## 2019-11-14 DIAGNOSIS — R87619 Unspecified abnormal cytological findings in specimens from cervix uteri: Secondary | ICD-10-CM | POA: Insufficient documentation

## 2019-11-15 ENCOUNTER — Telehealth: Payer: Self-pay | Admitting: *Deleted

## 2019-11-15 NOTE — Telephone Encounter (Signed)
Telephoned patient at home number and advised patient of abnormal pap smear. Advised patient would need colposcopy at next visit. All questions answered. Patient voiced understanding.

## 2019-11-15 NOTE — Telephone Encounter (Signed)
-----   Message from Kimberly R Booker, CNM sent at 11/15/2019 12:15 PM EDT ----- Hasn't read mychart msg. Please let her know pap abnormal, will do colpo w/ MD at next visit. Thanks! 

## 2019-11-15 NOTE — Telephone Encounter (Signed)
Pt returning your call

## 2019-11-15 NOTE — Telephone Encounter (Signed)
-----   Message from Cheral Marker, [place] sent at 11/15/2019 12:15 PM EDT ----- Hasn't read mychart msg. Please let her know pap abnormal, will do colpo w/ MD at next visit. Thanks!

## 2019-11-15 NOTE — Telephone Encounter (Signed)
Telephoned patient at home number and left message.  

## 2019-11-21 ENCOUNTER — Encounter: Payer: Self-pay | Admitting: Women's Health

## 2019-11-21 DIAGNOSIS — Z141 Cystic fibrosis carrier: Secondary | ICD-10-CM | POA: Insufficient documentation

## 2019-12-05 ENCOUNTER — Other Ambulatory Visit: Payer: Self-pay

## 2019-12-05 ENCOUNTER — Encounter: Payer: Self-pay | Admitting: Obstetrics & Gynecology

## 2019-12-05 ENCOUNTER — Ambulatory Visit (INDEPENDENT_AMBULATORY_CARE_PROVIDER_SITE_OTHER): Payer: Medicaid Other | Admitting: Obstetrics & Gynecology

## 2019-12-05 VITALS — BP 113/74 | HR 68 | Wt 218.5 lb

## 2019-12-05 DIAGNOSIS — Z6791 Unspecified blood type, Rh negative: Secondary | ICD-10-CM

## 2019-12-05 DIAGNOSIS — Z3A32 32 weeks gestation of pregnancy: Secondary | ICD-10-CM

## 2019-12-05 DIAGNOSIS — Z2913 Encounter for prophylactic Rho(D) immune globulin: Secondary | ICD-10-CM | POA: Diagnosis not present

## 2019-12-05 DIAGNOSIS — O09523 Supervision of elderly multigravida, third trimester: Secondary | ICD-10-CM

## 2019-12-05 DIAGNOSIS — Z1389 Encounter for screening for other disorder: Secondary | ICD-10-CM

## 2019-12-05 DIAGNOSIS — O26893 Other specified pregnancy related conditions, third trimester: Secondary | ICD-10-CM

## 2019-12-05 DIAGNOSIS — Z331 Pregnant state, incidental: Secondary | ICD-10-CM

## 2019-12-05 DIAGNOSIS — Z3483 Encounter for supervision of other normal pregnancy, third trimester: Secondary | ICD-10-CM | POA: Insufficient documentation

## 2019-12-05 LAB — POCT URINALYSIS DIPSTICK OB
Blood, UA: NEGATIVE
Glucose, UA: NEGATIVE
Ketones, UA: NEGATIVE
Leukocytes, UA: NEGATIVE
Nitrite, UA: NEGATIVE
POC,PROTEIN,UA: NEGATIVE

## 2019-12-05 NOTE — Progress Notes (Addendum)
LOW-RISK PREGNANCY VISIT Patient name: Kelli Mullins MRN [id number]  Date of birth: 1981/01/23 Chief Complaint:   Routine Prenatal Visit (colpo; ASC-H with -HRHPV)  History of Present Illness:   Kelli Mullins is a 39 y.o. G72P5015 female at [redacted]w[redacted]d with an Estimated Date of Delivery: 01/29/20 being seen today for ongoing management of a low-risk pregnancy.  Depression screen [facility] 2/9 11/07/2019 08/19/2018  Decreased Interest 0 0  Down, Depressed, Hopeless 0 0  PHQ - 2 Score 0 0  Altered sleeping 0 -  Tired, decreased energy 1 -  Change in appetite 1 -  Feeling bad or failure about yourself  0 -  Trouble concentrating 0 -  Moving slowly or fidgety/restless 0 -  Suicidal thoughts 0 -  PHQ-9 Score 2 -    Today she reports no complaints. Contractions: Not present. Vag. Bleeding: None.  Movement: Present. denies leaking of fluid. Review of Systems:   Pertinent items are noted in HPI Denies abnormal vaginal discharge w/ itching/odor/irritation, headaches, visual changes, shortness of breath, chest pain, abdominal pain, severe nausea/vomiting, or problems with urination or bowel movements unless otherwise stated above. Pertinent History Reviewed:  Reviewed past medical,surgical, social, obstetrical and family history.  Reviewed problem list, medications and allergies. Physical Assessment:   Vitals:   12/05/19 1502  BP: 113/74  Pulse: 68  Weight: 218 lb 8 oz (99.1 kg)  Body mass index is 36.36 kg/m.        Physical Examination:   General appearance: Well appearing, and in no distress  Mental status: Alert, oriented to person, place, and time  Skin: Warm & dry  Cardiovascular: Normal heart rate noted  Respiratory: Normal respiratory effort, no distress  Abdomen: Soft, gravid, nontender  Pelvic: Cervical exam performed  Colposcopy difficult at 32 weeks no cervical lesions which would change pregnancy management recommend repeat colpo 12 weeks postpartum        Extremities: Edema:  None  Fetal Status: Fetal Heart Rate (bpm): 144 Fundal Height: 34 cm Movement: Present    Chaperone: Peggy Dones    Results for orders placed or performed in visit on 12/05/19 (from the past 24 hour(s))  POC Urinalysis Dipstick OB   Collection Time: 12/05/19  3:03 PM  Result Value Ref Range   Color, UA     Clarity, UA     Glucose, UA Negative Negative   Bilirubin, UA     Ketones, UA neg    Spec Grav, UA     Blood, UA neg    pH, UA     POC,PROTEIN,UA Negative Negative, Trace, Small (1+), Moderate (2+), Large (3+), 4+   Urobilinogen, UA     Nitrite, UA neg    Leukocytes, UA Negative Negative   Appearance     Odor      Assessment & Plan:  1) Low-risk pregnancy [id number] at [redacted]w[redacted]d with an Estimated Date of Delivery: 01/29/20   2) ASC-H normal appearing cervix will need pp colpo at 12 weeks,    Meds: No orders of the defined types were placed in this encounter.  Labs/procedures today:   Plan:  Continue routine obstetrical care  Next visit: prefers in person    Reviewed: Preterm labor symptoms and general obstetric precautions including but not limited to vaginal bleeding, contractions, leaking of fluid and fetal movement were reviewed in detail with the patient.  All questions were answered. Has home bp cuff. Rx faxed to . Check bp weekly, let us know if >140/90.   Follow-up:  Return in about 2 weeks (around 12/19/2019) for LROB.  Orders Placed This Encounter  Procedures  . RHO (D) Immune Globulin  . POC Urinalysis Dipstick OB   Lazaro Arms  12/05/2019 3:43 PM

## 2019-12-07 LAB — URINE CULTURE

## 2019-12-20 ENCOUNTER — Ambulatory Visit (INDEPENDENT_AMBULATORY_CARE_PROVIDER_SITE_OTHER): Payer: Medicaid Other | Admitting: Obstetrics & Gynecology

## 2019-12-20 ENCOUNTER — Encounter: Payer: Self-pay | Admitting: Obstetrics & Gynecology

## 2019-12-20 VITALS — BP 112/73 | HR 92 | Wt 227.0 lb

## 2019-12-20 DIAGNOSIS — Z331 Pregnant state, incidental: Secondary | ICD-10-CM

## 2019-12-20 DIAGNOSIS — B182 Chronic viral hepatitis C: Secondary | ICD-10-CM

## 2019-12-20 DIAGNOSIS — Z3A34 34 weeks gestation of pregnancy: Secondary | ICD-10-CM

## 2019-12-20 DIAGNOSIS — Z1389 Encounter for screening for other disorder: Secondary | ICD-10-CM

## 2019-12-20 DIAGNOSIS — Z3483 Encounter for supervision of other normal pregnancy, third trimester: Secondary | ICD-10-CM | POA: Insufficient documentation

## 2019-12-20 LAB — POCT URINALYSIS DIPSTICK OB
Blood, UA: NEGATIVE
Glucose, UA: NEGATIVE
Ketones, UA: NEGATIVE
Leukocytes, UA: NEGATIVE
Nitrite, UA: NEGATIVE
POC,PROTEIN,UA: NEGATIVE

## 2019-12-20 NOTE — Progress Notes (Signed)
   LOW-RISK PREGNANCY VISIT Patient name: Kelli Mullins MRN [id number]  Date of birth: November 04, 1980 Chief Complaint:   Routine Prenatal Visit  History of Present Illness:   Kelli Mullins is a 39 y.o. [id number] female at [redacted]w[redacted]d with an Estimated Date of Delivery: 01/29/20 being seen today for ongoing management of a low-risk pregnancy.  Depression screen [facility] 2/9 11/07/2019 08/19/2018  Decreased Interest 0 0  Down, Depressed, Hopeless 0 0  PHQ - 2 Score 0 0  Altered sleeping 0 -  Tired, decreased energy 1 -  Change in appetite 1 -  Feeling bad or failure about yourself  0 -  Trouble concentrating 0 -  Moving slowly or fidgety/restless 0 -  Suicidal thoughts 0 -  PHQ-9 Score 2 -    Today she reports no complaints. Contractions: Not present. Vag. Bleeding: None.  Movement: Present. denies leaking of fluid. Review of Systems:   Pertinent items are noted in HPI Denies abnormal vaginal discharge w/ itching/odor/irritation, headaches, visual changes, shortness of breath, chest pain, abdominal pain, severe nausea/vomiting, or problems with urination or bowel movements unless otherwise stated above. Pertinent History Reviewed:  Reviewed past medical,surgical, social, obstetrical and family history.  Reviewed problem list, medications and allergies. Physical Assessment:   Vitals:   12/20/19 1352  BP: 112/73  Pulse: 92  Weight: 227 lb (103 kg)  Body mass index is 37.77 kg/m.        Physical Examination:   General appearance: Well appearing, and in no distress  Mental status: Alert, oriented to person, place, and time  Skin: Warm & dry  Cardiovascular: Normal heart rate noted  Respiratory: Normal respiratory effort, no distress  Abdomen: Soft, gravid, nontender  Pelvic: Cervical exam deferred         Extremities: Edema: None  Fetal Status: Fetal Heart Rate (bpm): 140 Fundal Height: 36 cm Movement: Present    Chaperone: n/a    Results for orders placed or performed in visit on 12/20/19  (from the past 24 hour(s))  POC Urinalysis Dipstick OB   Collection Time: 12/20/19  1:53 PM  Result Value Ref Range   Color, UA     Clarity, UA     Glucose, UA Negative Negative   Bilirubin, UA     Ketones, UA neg    Spec Grav, UA     Blood, UA neg    pH, UA     POC,PROTEIN,UA Negative Negative, Trace, Small (1+), Moderate (2+), Large (3+), 4+   Urobilinogen, UA     Nitrite, UA neg    Leukocytes, UA Negative Negative   Appearance     Odor      Assessment & Plan:  1) Low-risk pregnancy [id number] at [redacted]w[redacted]d with an Estimated Date of Delivery: 01/29/20   2) Grand multip, TXA @ delivery   Meds: No orders of the defined types were placed in this encounter.  Labs/procedures today:   Plan:  Continue routine obstetrical care  Next visit: prefers in person    Reviewed: Preterm labor symptoms and general obstetric precautions including but not limited to vaginal bleeding, contractions, leaking of fluid and fetal movement were reviewed in detail with the patient.  All questions were answered. Has home bp cuff. Rx faxed to . Check bp weekly, let us know if >140/90.   Follow-up: Return in about 2 weeks (around 01/03/2020) for LROB.  Orders Placed This Encounter  Procedures  . POC Urinalysis Dipstick OB   Amaryllis Dyke Pearley Millington 12/20/2019 2:10 PM

## 2020-01-03 ENCOUNTER — Encounter: Payer: Medicaid Other | Admitting: Women's Health

## 2020-01-04 ENCOUNTER — Other Ambulatory Visit (HOSPITAL_COMMUNITY)
Admission: RE | Admit: 2020-01-04 | Discharge: 2020-01-04 | Disposition: A | Discharge status: Home / Self Care | Payer: Medicaid Other | Source: Ambulatory Visit | Attending: Obstetrics and Gynecology | Admitting: Obstetrics and Gynecology

## 2020-01-04 ENCOUNTER — Ambulatory Visit (INDEPENDENT_AMBULATORY_CARE_PROVIDER_SITE_OTHER): Payer: Medicaid Other | Admitting: Obstetrics and Gynecology

## 2020-01-04 VITALS — BP 123/77 | HR 78 | Wt 232.0 lb

## 2020-01-04 DIAGNOSIS — Z3483 Encounter for supervision of other normal pregnancy, third trimester: Secondary | ICD-10-CM

## 2020-01-04 DIAGNOSIS — Z3A36 36 weeks gestation of pregnancy: Secondary | ICD-10-CM | POA: Insufficient documentation

## 2020-01-04 DIAGNOSIS — O0943 Supervision of pregnancy with grand multiparity, third trimester: Secondary | ICD-10-CM

## 2020-01-04 NOTE — Progress Notes (Signed)
Patient ID: Kelli Mullins, female   DOB: 1981/04/05, 39 y.o.   MRN: [id number]   LOW-RISK PREGNANCY VISIT Patient name: Kelli Mullins MRN [id number]  Date of birth: 02/06/1981 Chief Complaint:   Routine Prenatal Visit  History of Present Illness:   Kelli Mullins is a 39 y.o. [id number] female at [redacted]w[redacted]d with an Estimated Date of Delivery: 01/29/20 being seen today for ongoing management of a low-risk pregnancy.  Today she reports tightness and pressure. The patient is taking PO iron and denies constipation. She is accompanied by the FOB and this is their first child together. The baby is staying active. She has not given birth at [facility] yet. Her labors are usually around 2 hours and she gets epidurals. She would like to get her tubes tied. To sign  Consents today.  It is not clear if partner has been given the info on Hep C by pt. Contractions: Not present. Vag. Bleeding: None.  Movement: Present. denies leaking of fluid. Review of Systems:   Pertinent items are noted in HPI Denies abnormal vaginal discharge w/ itching/odor/irritation, headaches, visual changes, shortness of breath, chest pain, abdominal pain, severe nausea/vomiting, or problems with urination or bowel movements unless otherwise stated above. Pertinent History Reviewed:  Reviewed past medical,surgical, social, obstetrical and family history.  Reviewed problem list, medications and allergies. Physical Assessment:   Vitals:   01/04/20 1156  BP: 123/77  Pulse: 78  Weight: 232 lb (105.2 kg)  Body mass index is 38.61 kg/m.        Physical Examination:   General appearance: Well appearing, and in no distress  Mental status: Alert, oriented to person, place, and time  Skin: Warm & dry  Cardiovascular: Normal heart rate noted  Respiratory: Normal respiratory effort, no distress  Abdomen: Soft, gravid, nontender  Pelvic: Dilation: 1      Extremities: Edema: None  Fetal Status: Fetal Heart Rate (bpm): 172 Fundal Height: 36 cm  Movement: Present Presentation: Vertex  No results found for this or any previous visit (from the past 24 hour(s)).  Assessment & Plan:  1) Low-risk pregnancy [id number] at [redacted]w[redacted]d with an Estimated Date of Delivery: 01/29/20   2) Grand multip, TXA @ delivery  3) Desires BTL, papers were signed today   Plan:  Continue routine obstetrical care  Meds: No orders of the defined types were placed in this encounter.  Labs/procedures today: GBS, GC/CHL  Reviewed: Term labor symptoms and general obstetric precautions including but not limited to vaginal bleeding, contractions, leaking of fluid and fetal movement were reviewed in detail with the patient.  All questions were answered  Follow-up: Return in about 1 week (around 01/11/2020) for LROB.  By signing my name below, I, Pietro Cassis, attest that this documentation has been prepared under the direction and in the presence of Tilda Burrow, MD. Electronically Signed: Pietro Cassis, Medical Scribe. 01/04/20. 12:26 PM.  I personally performed the services described in this documentation, which was SCRIBED in my presence. The recorded information has been reviewed and considered accurate. It has been edited as necessary during review. Tilda Burrow, MD

## 2020-01-05 LAB — CERVICOVAGINAL ANCILLARY ONLY
Chlamydia: NEGATIVE
Comment: NEGATIVE
Comment: NORMAL
Neisseria Gonorrhea: NEGATIVE

## 2020-01-08 LAB — CULTURE, BETA STREP (GROUP B ONLY): Strep Gp B Culture: NEGATIVE

## 2020-01-09 NOTE — Progress Notes (Signed)
GBS aka strep test, is negative.

## 2020-01-12 ENCOUNTER — Encounter: Payer: Self-pay | Admitting: Advanced Practice Midwife

## 2020-01-12 ENCOUNTER — Ambulatory Visit (INDEPENDENT_AMBULATORY_CARE_PROVIDER_SITE_OTHER): Payer: Medicaid Other | Admitting: Advanced Practice Midwife

## 2020-01-12 VITALS — BP 117/74 | HR 89 | Wt 236.8 lb

## 2020-01-12 DIAGNOSIS — Z3A37 37 weeks gestation of pregnancy: Secondary | ICD-10-CM

## 2020-01-12 DIAGNOSIS — Z1389 Encounter for screening for other disorder: Secondary | ICD-10-CM

## 2020-01-12 DIAGNOSIS — Z3480 Encounter for supervision of other normal pregnancy, unspecified trimester: Secondary | ICD-10-CM

## 2020-01-12 DIAGNOSIS — Z331 Pregnant state, incidental: Secondary | ICD-10-CM

## 2020-01-12 DIAGNOSIS — Z3483 Encounter for supervision of other normal pregnancy, third trimester: Secondary | ICD-10-CM

## 2020-01-12 LAB — POCT URINALYSIS DIPSTICK OB
Blood, UA: NEGATIVE
Glucose, UA: NEGATIVE
Ketones, UA: NEGATIVE
Leukocytes, UA: NEGATIVE
Nitrite, UA: NEGATIVE
POC,PROTEIN,UA: NEGATIVE

## 2020-01-12 NOTE — Patient Instructions (Signed)

## 2020-01-12 NOTE — Progress Notes (Signed)
   LOW-RISK PREGNANCY VISIT Patient name: Kelli Mullins MRN [id number]  Date of birth: [birth year] Chief Complaint:   Routine Prenatal Visit  History of Present Illness:   Kelli Mullins is a 39 y.o. Q3R0076 female at [redacted]w[redacted]d with an Estimated Date of Delivery: 01/29/20 being seen today for ongoing management of a low-risk pregnancy.  Today she reports swelling in hands the other day, very painful.  Contractions: Not present.  .  Movement: Present. denies leaking of fluid. Review of Systems:   Pertinent items are noted in HPI Denies abnormal vaginal discharge w/ itching/odor/irritation, headaches, visual changes, shortness of breath, chest pain, abdominal pain, severe nausea/vomiting, or problems with urination or bowel movements unless otherwise stated above. Pertinent History Reviewed:  Reviewed past medical,surgical, social, obstetrical and family history.  Reviewed problem list, medications and allergies. Physical Assessment:   Vitals:   01/12/20 1545  BP: 117/74  Pulse: 89  Weight: 236 lb 12.8 oz (107.4 kg)  Body mass index is 39.41 kg/m.        Physical Examination:   General appearance: Well appearing, and in no distress  Mental status: Alert, oriented to person, place, and time  Skin: Warm & dry  Cardiovascular: Normal heart rate noted  Respiratory: Normal respiratory effort, no distress  Abdomen: Soft, gravid, nontender  Pelvic: Cervical exam performed  Dilation: 1.5 Effacement (%): Thick Station: Ballotable  Extremities: Edema: None  Fetal Status: Fetal Heart Rate (bpm): 145 Fundal Height: 37 cm Movement: Present Presentation: Vertex  Chaperone: FOB    Results for orders placed or performed in visit on 01/12/20 (from the past 24 hour(s))  POC Urinalysis Dipstick OB   Collection Time: 01/12/20  3:55 PM  Result Value Ref Range   Color, UA     Clarity, UA     Glucose, UA Negative Negative   Bilirubin, UA     Ketones, UA neg    Spec Grav, UA     Blood, UA neg    pH, UA      POC,PROTEIN,UA Negative Negative, Trace, Small (1+), Moderate (2+), Large (3+), 4+   Urobilinogen, UA     Nitrite, UA neg    Leukocytes, UA Negative Negative   Appearance     Odor      Assessment & Plan:  1) Low-risk pregnancy [id number] at [redacted]w[redacted]d with an Estimated Date of Delivery: 01/29/20     Meds: No orders of the defined types were placed in this encounter.  Labs/procedures today: none  Plan:  Continue routine obstetrical care  Next visit: prefers in person    Reviewed: Term labor symptoms and general obstetric precautions including but not limited to vaginal bleeding, contractions, leaking of fluid and fetal movement were reviewed in detail with the patient.  All questions were answered.   Follow-up: Return in about 1 week (around 01/19/2020) for LROB.  Orders Placed This Encounter  Procedures  . POC Urinalysis Dipstick OB   Jacklyn Shell DNP, CNM 01/12/2020 4:23 PM

## 2020-01-19 ENCOUNTER — Encounter: Payer: Self-pay | Admitting: Advanced Practice Midwife

## 2020-01-19 ENCOUNTER — Ambulatory Visit (INDEPENDENT_AMBULATORY_CARE_PROVIDER_SITE_OTHER): Payer: Medicaid Other | Admitting: Advanced Practice Midwife

## 2020-01-19 VITALS — BP 124/80 | HR 84 | Wt 237.0 lb

## 2020-01-19 DIAGNOSIS — Z3483 Encounter for supervision of other normal pregnancy, third trimester: Secondary | ICD-10-CM

## 2020-01-19 DIAGNOSIS — Z331 Pregnant state, incidental: Secondary | ICD-10-CM

## 2020-01-19 DIAGNOSIS — Z1389 Encounter for screening for other disorder: Secondary | ICD-10-CM

## 2020-01-19 DIAGNOSIS — Z3A38 38 weeks gestation of pregnancy: Secondary | ICD-10-CM

## 2020-01-19 LAB — POCT URINALYSIS DIPSTICK OB
Blood, UA: NEGATIVE
Glucose, UA: NEGATIVE
Ketones, UA: NEGATIVE
Leukocytes, UA: NEGATIVE
Nitrite, UA: NEGATIVE
POC,PROTEIN,UA: NEGATIVE

## 2020-01-19 NOTE — Patient Instructions (Signed)

## 2020-01-19 NOTE — Progress Notes (Signed)
   LOW-RISK PREGNANCY VISIT Patient name: Kelli Mullins MRN [id number]  Date of birth: 19-Sep-1980 Chief Complaint:   Routine Prenatal Visit  History of Present Illness:   Kelli Mullins is a 39 y.o. W7P7106 female at [redacted]w[redacted]d with an Estimated Date of Delivery: 01/29/20 being seen today for ongoing management of a low-risk pregnancy.  Today she reports no complaints. Contractions: Not present. Vag. Bleeding: None.  Movement: Present. denies leaking of fluid today but had some come out last night.  Review of Systems:   Pertinent items are noted in HPI Denies abnormal vaginal discharge w/ itching/odor/irritation, headaches, visual changes, shortness of breath, chest pain, abdominal pain, severe nausea/vomiting, or problems with urination or bowel movements unless otherwise stated above. Pertinent History Reviewed:  Reviewed past medical,surgical, social, obstetrical and family history.  Reviewed problem list, medications and allergies. Physical Assessment:   Vitals:   01/19/20 1616  BP: 124/80  Pulse: 84  Weight: 237 lb (107.5 kg)  Body mass index is 39.44 kg/m.        Physical Examination:   General appearance: Well appearing, and in no distress  Mental status: Alert, oriented to person, place, and time  Skin: Warm & dry  Cardiovascular: Normal heart rate noted  Respiratory: Normal respiratory effort, no distress  Abdomen: Soft, gravid, nontender  Pelvic: Cervical exam performed       SSE:  No pooling, neg valsalva/fern  Extremities: Edema: None  Fetal Status:     Movement: Present    Chaperone: catie sullivan    Results for orders placed or performed in visit on 01/19/20 (from the past 24 hour(s))  POC Urinalysis Dipstick OB   Collection Time: 01/19/20  4:19 PM  Result Value Ref Range   Color, UA     Clarity, UA     Glucose, UA Negative Negative   Bilirubin, UA     Ketones, UA neg    Spec Grav, UA     Blood, UA neg    pH, UA     POC,PROTEIN,UA Negative Negative, Trace,  Small (1+), Moderate (2+), Large (3+), 4+   Urobilinogen, UA     Nitrite, UA neg    Leukocytes, UA Negative Negative   Appearance     Odor      Assessment & Plan:  1) Low-risk pregnancy [id number] at [redacted]w[redacted]d with an Estimated Date of Delivery: 01/29/20    Meds: No orders of the defined types were placed in this encounter.  Labs/procedures today: none  Plan:  Continue routine obstetrical care  Next visit: prefers in person    Reviewed: Term labor symptoms and general obstetric precautions including but not limited to vaginal bleeding, contractions, leaking of fluid and fetal movement were reviewed in detail with the patient.  All questions were answered. Has home bp cuff. Check bp weekly, let us know if >140/90.   Follow-up: Return in about 1 week (around 01/26/2020) for LROB.  Orders Placed This Encounter  Procedures  . POC Urinalysis Dipstick OB   Jacklyn Shell DNP, CNM 01/19/2020 4:24 PM

## 2020-01-26 ENCOUNTER — Encounter: Payer: Self-pay | Admitting: Advanced Practice Midwife

## 2020-01-26 ENCOUNTER — Ambulatory Visit (INDEPENDENT_AMBULATORY_CARE_PROVIDER_SITE_OTHER): Payer: Medicaid Other | Admitting: Advanced Practice Midwife

## 2020-01-26 VITALS — BP 123/77 | HR 96 | Wt 239.5 lb

## 2020-01-26 DIAGNOSIS — Z331 Pregnant state, incidental: Secondary | ICD-10-CM

## 2020-01-26 DIAGNOSIS — Z3483 Encounter for supervision of other normal pregnancy, third trimester: Secondary | ICD-10-CM

## 2020-01-26 DIAGNOSIS — Z1389 Encounter for screening for other disorder: Secondary | ICD-10-CM

## 2020-01-26 DIAGNOSIS — Z3A39 39 weeks gestation of pregnancy: Secondary | ICD-10-CM

## 2020-01-26 LAB — POCT URINALYSIS DIPSTICK OB
Glucose, UA: NEGATIVE
Ketones, UA: NEGATIVE
Leukocytes, UA: NEGATIVE
Nitrite, UA: NEGATIVE

## 2020-01-26 NOTE — Patient Instructions (Signed)

## 2020-01-26 NOTE — Progress Notes (Signed)
   LOW-RISK PREGNANCY VISIT Patient name: Kelli Mullins MRN [id number]  Date of birth: [birth year] Chief Complaint:   Routine Prenatal Visit  History of Present Illness:   Kelli Mullins is a 39 y.o. O5D6644 female at [redacted]w[redacted]d with an Estimated Date of Delivery: 01/29/20 being seen today for ongoing management of a low-risk pregnancy.  Today she reports no bleeding, no contractions, no cramping, no leaking and occasional contractions. Contractions: Irregular. Vag. Bleeding: None.  Movement: Present. denies leaking of fluid. Review of Systems:   Pertinent items are noted in HPI Denies abnormal vaginal discharge w/ itching/odor/irritation, headaches, visual changes, shortness of breath, chest pain, abdominal pain, severe nausea/vomiting, or problems with urination or bowel movements unless otherwise stated above. Pertinent History Reviewed:  Reviewed past medical,surgical, social, obstetrical and family history.  Reviewed problem list, medications and allergies. Physical Assessment:   Vitals:   01/26/20 1337  BP: 123/77  Pulse: 96  Weight: 239 lb 8 oz (108.6 kg)  Body mass index is 39.85 kg/m.        Physical Examination:   General appearance: Well appearing, and in no distress  Mental status: Alert, oriented to person, place, and time  Skin: Warm & dry  Cardiovascular: Normal heart rate noted  Respiratory: Normal respiratory effort, no distress  Abdomen: Soft, gravid, nontender  Pelvic: Cervical exam performed  Dilation: 3 Effacement (%): 60 Station: -2  Extremities: Edema: None  Fetal Status:     Movement: Present Presentation: Vertex Fundal Height 40 cm. FHR 147  Chaperone: Cathie Beams CNM    Results for orders placed or performed in visit on 01/26/20 (from the past 24 hour(s))  POC Urinalysis Dipstick OB   Collection Time: 01/26/20  1:38 PM  Result Value Ref Range   Color, UA     Clarity, UA     Glucose, UA Negative Negative   Bilirubin, UA     Ketones, UA neg     Spec Grav, UA     Blood, UA trace    pH, UA     POC,PROTEIN,UA Trace Negative, Trace, Small (1+), Moderate (2+), Large (3+), 4+   Urobilinogen, UA     Nitrite, UA neg    Leukocytes, UA Negative Negative   Appearance     Odor      Assessment & Plan:  1) Low-risk pregnancy [id number] at [redacted]w[redacted]d with an Estimated Date of Delivery: 01/29/20     Meds: No orders of the defined types were placed in this encounter.  Labs/procedures today: N/A  Plan:  Continue routine obstetrical care. Next visit: prefers in person    Reviewed: Term labor symptoms and general obstetric precautions including but not limited to vaginal bleeding, contractions, leaking of fluid and fetal movement were reviewed in detail with the patient.  All questions were answered. Has home bp cuff. Check bp weekly, let us know if >140/90.   Follow-up: Return in about 1 week (around 02/02/2020) for LROB/NST (tues, wed or THurs of next week).  Orders Placed This Encounter  Procedures  . POC Urinalysis Dipstick OB   Fuller Mandril S-NP 01/26/2020 2:08 PM

## 2020-01-31 ENCOUNTER — Ambulatory Visit (INDEPENDENT_AMBULATORY_CARE_PROVIDER_SITE_OTHER): Payer: Medicaid Other | Admitting: Obstetrics & Gynecology

## 2020-01-31 ENCOUNTER — Encounter: Payer: Self-pay | Admitting: Obstetrics & Gynecology

## 2020-01-31 ENCOUNTER — Other Ambulatory Visit: Payer: Self-pay

## 2020-01-31 VITALS — BP 132/79 | HR 83 | Wt 242.0 lb

## 2020-01-31 DIAGNOSIS — Z3A40 40 weeks gestation of pregnancy: Secondary | ICD-10-CM | POA: Diagnosis not present

## 2020-01-31 DIAGNOSIS — Z1389 Encounter for screening for other disorder: Secondary | ICD-10-CM

## 2020-01-31 DIAGNOSIS — O480 Post-term pregnancy: Secondary | ICD-10-CM

## 2020-01-31 DIAGNOSIS — Z331 Pregnant state, incidental: Secondary | ICD-10-CM

## 2020-01-31 LAB — POCT URINALYSIS DIPSTICK OB
Blood, UA: NEGATIVE
Glucose, UA: NEGATIVE
Ketones, UA: NEGATIVE
Leukocytes, UA: NEGATIVE
Nitrite, UA: NEGATIVE
POC,PROTEIN,UA: NEGATIVE

## 2020-01-31 NOTE — Progress Notes (Signed)
LOW-RISK PREGNANCY VISIT Patient name: Kelli Mullins MRN [id number]  Date of birth: 12/03/1980 Chief Complaint:   Routine Prenatal Visit (NST)  History of Present Illness:   Syrita Dovel is a 39 y.o. G2X5284 female at [redacted]w[redacted]d with an Estimated Date of Delivery: 01/29/20 being seen today for ongoing management of a low-risk pregnancy.  Depression screen [facility] 2/9 11/07/2019 08/19/2018  Decreased Interest 0 0  Down, Depressed, Hopeless 0 0  PHQ - 2 Score 0 0  Altered sleeping 0 -  Tired, decreased energy 1 -  Change in appetite 1 -  Feeling bad or failure about yourself  0 -  Trouble concentrating 0 -  Moving slowly or fidgety/restless 0 -  Suicidal thoughts 0 -  PHQ-9 Score 2 -    Today she reports no complaints. Contractions: Irregular. Vag. Bleeding: None.  Movement: Present. denies leaking of fluid. Review of Systems:   Pertinent items are noted in HPI Denies abnormal vaginal discharge w/ itching/odor/irritation, headaches, visual changes, shortness of breath, chest pain, abdominal pain, severe nausea/vomiting, or problems with urination or bowel movements unless otherwise stated above. Pertinent History Reviewed:  Reviewed past medical,surgical, social, obstetrical and family history.  Reviewed problem list, medications and allergies. Physical Assessment:   Vitals:   01/31/20 1340  BP: 132/79  Pulse: 83  Weight: 242 lb (109.8 kg)  Body mass index is 40.27 kg/m.        Physical Examination:   General appearance: Well appearing, and in no distress  Mental status: Alert, oriented to person, place, and time  Skin: Warm & dry  Cardiovascular: Normal heart rate noted  Respiratory: Normal respiratory effort, no distress  Abdomen: Soft, gravid, nontender  Pelvic: Cervical exam performed  Dilation: 3 Effacement (%): Thick Station: -3  Extremities: Edema: None  Fetal Status: Fetal Heart Rate (bpm): 140 Fundal Height: 41 cm Movement: Present Presentation: Vertex  Chaperone:  Stoney Bang    Results for orders placed or performed in visit on 01/31/20 (from the past 24 hour(s))  POC Urinalysis Dipstick OB   Collection Time: 01/31/20  1:42 PM  Result Value Ref Range   Color, UA     Clarity, UA     Glucose, UA Negative Negative   Bilirubin, UA     Ketones, UA n    Spec Grav, UA     Blood, UA n    pH, UA     POC,PROTEIN,UA Negative Negative, Trace, Small (1+), Moderate (2+), Large (3+), 4+   Urobilinogen, UA     Nitrite, UA n    Leukocytes, UA Negative Negative   Appearance     Odor      Assessment & Plan:  1) Low-risk pregnancy [id number] at [redacted]w[redacted]d with an Estimated Date of Delivery: 01/29/20   2) Pending post dates, membranes stripped Reactive NST today, IOL 02/04/20@2345    Meds: No orders of the defined types were placed in this encounter.  Labs/procedures today: Reactive NST  Rima Blizzard is at [redacted]w[redacted]d Estimated Date of Delivery: 01/29/20  NST being performed due to impending post dates  Today the NST is Reactive  Fetal Monitoring:  Baseline: 140 bpm, Variability: Good {> 6 bpm), Accelerations: Reactive and Decelerations: Absent   reactive  The accelerations are >15 bpm and more than 2 in 20 minutes  Final diagnosis:  Reactive NST  Lazaro Arms, MD     Plan:  Continue routine obstetrical care  Next visit: prefers in person    Reviewed: Term labor symptoms and  general obstetric precautions including but not limited to vaginal bleeding, contractions, leaking of fluid and fetal movement were reviewed in detail with the patient.  All questions were answered. Has home bp cuff. Rx faxed to . Check bp weekly, let us know if >140/90.   Follow-up: Return in about 6 weeks (around 03/13/2020) for post partum visit.  Orders Placed This Encounter  Procedures  . POC Urinalysis Dipstick OB   Lazaro Arms  01/31/2020 3:08 PM

## 2020-01-31 NOTE — Treatment Plan (Signed)
   Induction Assessment Scheduling Form: [facility]ax to [facility]:  [phone]  Trenise Turay                                                                                   DOB:  04-02-1981                                                            MRN:  [id number]                                                                     Phone #:   [phone]                         Provider:  [facility]  GP:  [phone]                                                            Estimated Date of Delivery: 01/29/20  Dating Criteria: 26 week sonogram    Medical Indications for induction:  Post term Admission Date/Time:  02/04/20 @2345  Gestational age on admission:  [redacted]w[redacted]d   Filed Weights   01/31/20 1340  Weight: 242 lb (109.8 kg)   HIV:  Non Reactive (04/19 1550) GBS: Negative/-- (06/16 1545)  3 cm dilated, 0 effaced, -3 station, cervical position midplane, consistency soft, Bishop score 8, presenting part vertex   Method of induction(proposed):  choice   Scheduling Provider Signature:  02-04-2001, MD                                            Today's Date:  01/31/2020

## 2020-02-01 ENCOUNTER — Encounter (HOSPITAL_COMMUNITY): Payer: Self-pay | Admitting: *Deleted

## 2020-02-01 ENCOUNTER — Telehealth (HOSPITAL_COMMUNITY): Payer: Self-pay | Admitting: *Deleted

## 2020-02-01 NOTE — Telephone Encounter (Signed)
Preadmission screen  

## 2020-02-02 ENCOUNTER — Other Ambulatory Visit (HOSPITAL_COMMUNITY): Payer: Self-pay | Admitting: Advanced Practice Midwife

## 2020-02-03 ENCOUNTER — Encounter (HOSPITAL_COMMUNITY): Payer: Self-pay | Admitting: Obstetrics and Gynecology

## 2020-02-03 ENCOUNTER — Other Ambulatory Visit (HOSPITAL_COMMUNITY)
Admission: RE | Admit: 2020-02-03 | Discharge: 2020-02-03 | Disposition: A | Discharge status: Home / Self Care | Payer: Medicaid Other | Source: Ambulatory Visit | Attending: Family Medicine | Admitting: Family Medicine

## 2020-02-03 ENCOUNTER — Inpatient Hospital Stay (HOSPITAL_COMMUNITY)
Admission: AD | Admit: 2020-02-03 | Discharge: 2020-02-06 | DRG: 797 | Disposition: A | Discharge status: Home / Self Care | Payer: Medicaid Other | Attending: Obstetrics & Gynecology | Admitting: Obstetrics & Gynecology

## 2020-02-03 ENCOUNTER — Other Ambulatory Visit: Payer: Self-pay

## 2020-02-03 ENCOUNTER — Inpatient Hospital Stay (HOSPITAL_COMMUNITY): Payer: Medicaid Other | Admitting: Anesthesiology

## 2020-02-03 DIAGNOSIS — O329XX0 Maternal care for malpresentation of fetus, unspecified, not applicable or unspecified: Secondary | ICD-10-CM | POA: Diagnosis not present

## 2020-02-03 DIAGNOSIS — O480 Post-term pregnancy: Secondary | ICD-10-CM | POA: Diagnosis present

## 2020-02-03 DIAGNOSIS — O134 Gestational [pregnancy-induced] hypertension without significant proteinuria, complicating childbirth: Secondary | ICD-10-CM | POA: Diagnosis present

## 2020-02-03 DIAGNOSIS — D696 Thrombocytopenia, unspecified: Secondary | ICD-10-CM | POA: Diagnosis present

## 2020-02-03 DIAGNOSIS — Z302 Encounter for sterilization: Secondary | ICD-10-CM | POA: Diagnosis not present

## 2020-02-03 DIAGNOSIS — Z23 Encounter for immunization: Secondary | ICD-10-CM | POA: Diagnosis not present

## 2020-02-03 DIAGNOSIS — Z3A40 40 weeks gestation of pregnancy: Secondary | ICD-10-CM

## 2020-02-03 DIAGNOSIS — Z3A41 41 weeks gestation of pregnancy: Secondary | ICD-10-CM | POA: Diagnosis present

## 2020-02-03 DIAGNOSIS — O9912 Other diseases of the blood and blood-forming organs and certain disorders involving the immune mechanism complicating childbirth: Secondary | ICD-10-CM | POA: Diagnosis present

## 2020-02-03 DIAGNOSIS — O26893 Other specified pregnancy related conditions, third trimester: Secondary | ICD-10-CM | POA: Diagnosis present

## 2020-02-03 DIAGNOSIS — O4292 Full-term premature rupture of membranes, unspecified as to length of time between rupture and onset of labor: Principal | ICD-10-CM | POA: Diagnosis present

## 2020-02-03 DIAGNOSIS — Z141 Cystic fibrosis carrier: Secondary | ICD-10-CM | POA: Diagnosis not present

## 2020-02-03 DIAGNOSIS — O99334 Smoking (tobacco) complicating childbirth: Secondary | ICD-10-CM | POA: Diagnosis present

## 2020-02-03 DIAGNOSIS — O42913 Preterm premature rupture of membranes, unspecified as to length of time between rupture and onset of labor, third trimester: Secondary | ICD-10-CM | POA: Diagnosis not present

## 2020-02-03 DIAGNOSIS — O99214 Obesity complicating childbirth: Secondary | ICD-10-CM | POA: Diagnosis present

## 2020-02-03 DIAGNOSIS — O0932 Supervision of pregnancy with insufficient antenatal care, second trimester: Secondary | ICD-10-CM

## 2020-02-03 DIAGNOSIS — Z6791 Unspecified blood type, Rh negative: Secondary | ICD-10-CM | POA: Diagnosis not present

## 2020-02-03 DIAGNOSIS — F17210 Nicotine dependence, cigarettes, uncomplicated: Secondary | ICD-10-CM | POA: Diagnosis present

## 2020-02-03 DIAGNOSIS — Z20822 Contact with and (suspected) exposure to covid-19: Secondary | ICD-10-CM | POA: Diagnosis present

## 2020-02-03 DIAGNOSIS — Z3480 Encounter for supervision of other normal pregnancy, unspecified trimester: Secondary | ICD-10-CM

## 2020-02-03 DIAGNOSIS — O4212 Full-term premature rupture of membranes, onset of labor more than 24 hours following rupture: Secondary | ICD-10-CM | POA: Diagnosis not present

## 2020-02-03 LAB — CBC
HCT: 33.9 % — ABNORMAL LOW (ref 36.0–46.0)
HCT: 32 % — ABNORMAL LOW (ref 36.0–46.0)
Hemoglobin: 11.4 g/dL — ABNORMAL LOW (ref 12.0–15.0)
Hemoglobin: 10.5 g/dL — ABNORMAL LOW (ref 12.0–15.0)
MCH: 32.9 pg (ref 26.0–34.0)
MCH: 32.1 pg (ref 26.0–34.0)
MCHC: 33.6 g/dL (ref 30.0–36.0)
MCHC: 32.8 g/dL (ref 30.0–36.0)
MCV: 98 fL (ref 80.0–100.0)
MCV: 97.9 fL (ref 80.0–100.0)
Platelets: 146 10*3/uL — ABNORMAL LOW (ref 150–400)
Platelets: 156 10*3/uL (ref 150–400)
RBC: 3.46 MIL/uL — ABNORMAL LOW (ref 3.87–5.11)
RBC: 3.27 MIL/uL — ABNORMAL LOW (ref 3.87–5.11)
RDW: 13.1 % (ref 11.5–15.5)
RDW: 13.2 % (ref 11.5–15.5)
WBC: 9.5 10*3/uL (ref 4.0–10.5)
WBC: 13.2 10*3/uL — ABNORMAL HIGH (ref 4.0–10.5)
nRBC: 0 % (ref 0.0–0.2)
nRBC: 0 % (ref 0.0–0.2)

## 2020-02-03 LAB — URINALYSIS, ROUTINE W REFLEX MICROSCOPIC
Bilirubin Urine: NEGATIVE
Glucose, UA: NEGATIVE mg/dL
Ketones, ur: NEGATIVE mg/dL
Leukocytes,Ua: NEGATIVE
Nitrite: NEGATIVE
Protein, ur: NEGATIVE mg/dL
Specific Gravity, Urine: 1.006 (ref 1.005–1.030)
pH: 6 (ref 5.0–8.0)

## 2020-02-03 LAB — COMPREHENSIVE METABOLIC PANEL
ALT: 12 U/L (ref 0–44)
AST: 17 U/L (ref 15–41)
Albumin: 3 g/dL — ABNORMAL LOW (ref 3.5–5.0)
Alkaline Phosphatase: 131 U/L — ABNORMAL HIGH (ref 38–126)
Anion gap: 8 (ref 5–15)
BUN: 9 mg/dL (ref 6–20)
CO2: 19 mmol/L — ABNORMAL LOW (ref 22–32)
Calcium: 8.4 mg/dL — ABNORMAL LOW (ref 8.9–10.3)
Chloride: 108 mmol/L (ref 98–111)
Creatinine, Ser: 0.5 mg/dL (ref 0.44–1.00)
GFR calc Af Amer: >60 mL/min (ref >60)
GFR calc non Af Amer: >60 mL/min (ref >60)
Glucose, Bld: 88 mg/dL (ref 70–99)
Potassium: 4 mmol/L (ref 3.5–5.1)
Sodium: 135 mmol/L (ref 135–145)
Total Bilirubin: 0.5 mg/dL (ref 0.3–1.2)
Total Protein: 6.6 g/dL (ref 6.5–8.1)

## 2020-02-03 LAB — POCT FERN TEST
POCT Fern Test: NEGATIVE
POCT Fern Test: POSITIVE

## 2020-02-03 LAB — PROTEIN / CREATININE RATIO, URINE
Creatinine, Urine: 37.33 mg/dL
Total Protein, Urine: <6 mg/dL

## 2020-02-03 LAB — TYPE AND SCREEN
ABO/RH(D): O NEG
Antibody Screen: POSITIVE

## 2020-02-03 LAB — SARS CORONAVIRUS 2 (TAT 6-24 HRS): SARS Coronavirus 2: NEGATIVE

## 2020-02-03 LAB — RPR: RPR Ser Ql: NONREACTIVE

## 2020-02-03 LAB — SARS CORONAVIRUS 2 BY RT PCR (HOSPITAL ORDER, PERFORMED IN ~~LOC~~ HOSPITAL LAB): SARS Coronavirus 2: NEGATIVE

## 2020-02-03 MED ORDER — FENTANYL CITRATE (PF) 100 MCG/2ML IJ SOLN
100.0000 ug | INTRAMUSCULAR | Status: DC | PRN
  Administered 2020-02-03: 100 ug via INTRAVENOUS
  Filled 2020-02-03: qty 2

## 2020-02-03 MED ORDER — ACETAMINOPHEN 325 MG PO TABS: 650.0000 mg | ORAL_TABLET | ORAL | Status: DC | PRN

## 2020-02-03 MED ORDER — EPHEDRINE 5 MG/ML INJ
10.0000 mg | INTRAVENOUS | Status: DC | PRN
  Filled 2020-02-03: qty 10

## 2020-02-03 MED ORDER — OXYTOCIN-SODIUM CHLORIDE 30-0.9 UT/500ML-% IV SOLN: 1.0000 m[IU]/min | INTRAVENOUS | Status: DC

## 2020-02-03 MED ORDER — DIPHENHYDRAMINE HCL 50 MG/ML IJ SOLN
12.5000 mg | INTRAMUSCULAR | Status: AC | PRN
  Administered 2020-02-03 – 2020-02-04 (×3): 12.5 mg via INTRAVENOUS
  Filled 2020-02-03 (×2): qty 1

## 2020-02-03 MED ORDER — OXYTOCIN BOLUS FROM INFUSION
333.0000 mL | Freq: Once | INTRAVENOUS | Status: AC
  Administered 2020-02-04: 333 mL via INTRAVENOUS

## 2020-02-03 MED ORDER — ONDANSETRON HCL 4 MG/2ML IJ SOLN
4.0000 mg | Freq: Four times a day (QID) | INTRAMUSCULAR | Status: DC | PRN
  Administered 2020-02-04: 4 mg via INTRAVENOUS
  Filled 2020-02-03: qty 2

## 2020-02-03 MED ORDER — OXYCODONE-ACETAMINOPHEN 5-325 MG PO TABS: 1.0000 | ORAL_TABLET | ORAL | Status: DC | PRN

## 2020-02-03 MED ORDER — LACTATED RINGERS AMNIOINFUSION
INTRAVENOUS | Status: DC
  Administered 2020-02-04: 300 mL via INTRAUTERINE

## 2020-02-03 MED ORDER — LACTATED RINGERS IV SOLN: INTRAVENOUS | Status: DC

## 2020-02-03 MED ORDER — LACTATED RINGERS IV SOLN
500.0000 mL | INTRAVENOUS | Status: DC | PRN
  Administered 2020-02-03 – 2020-02-04 (×4): 500 mL via INTRAVENOUS

## 2020-02-03 MED ORDER — FENTANYL-BUPIVACAINE-NACL 0.5-0.125-0.9 MG/250ML-% EP SOLN
12.0000 mL/h | EPIDURAL | Status: DC | PRN
  Administered 2020-02-04: 12 mL/h via EPIDURAL
  Filled 2020-02-03 (×2): qty 250

## 2020-02-03 MED ORDER — SOD CITRATE-CITRIC ACID 500-334 MG/5ML PO SOLN
30.0000 mL | ORAL | Status: DC | PRN
  Administered 2020-02-04: 30 mL via ORAL
  Filled 2020-02-03: qty 30

## 2020-02-03 MED ORDER — PHENYLEPHRINE 40 MCG/ML (10ML) SYRINGE FOR IV PUSH (FOR BLOOD PRESSURE SUPPORT)
80.0000 ug | PREFILLED_SYRINGE | INTRAVENOUS | Status: DC | PRN
  Administered 2020-02-04: 120 ug via INTRAVENOUS

## 2020-02-03 MED ORDER — OXYTOCIN-SODIUM CHLORIDE 30-0.9 UT/500ML-% IV SOLN
1.0000 m[IU]/min | INTRAVENOUS | Status: DC
  Administered 2020-02-03: 2 m[IU]/min via INTRAVENOUS
  Administered 2020-02-03: 10 m[IU]/min via INTRAVENOUS
  Filled 2020-02-03 (×2): qty 500

## 2020-02-03 MED ORDER — PHENYLEPHRINE 40 MCG/ML (10ML) SYRINGE FOR IV PUSH (FOR BLOOD PRESSURE SUPPORT)
80.0000 ug | PREFILLED_SYRINGE | INTRAVENOUS | Status: DC | PRN
  Filled 2020-02-03: qty 10

## 2020-02-03 MED ORDER — LIDOCAINE HCL (PF) 1 % IJ SOLN
INTRAMUSCULAR | Status: DC | PRN
  Administered 2020-02-03: 12 mL via EPIDURAL

## 2020-02-03 MED ORDER — TRANEXAMIC ACID-NACL 1000-0.7 MG/100ML-% IV SOLN
1000.0000 mg | Freq: Once | INTRAVENOUS | Status: AC
  Administered 2020-02-04: 1000 mg via INTRAVENOUS
  Filled 2020-02-03: qty 100

## 2020-02-03 MED ORDER — TERBUTALINE SULFATE 1 MG/ML IJ SOLN
0.2500 mg | Freq: Once | INTRAMUSCULAR | Status: DC | PRN
  Filled 2020-02-03: qty 1

## 2020-02-03 MED ORDER — SODIUM CHLORIDE (PF) 0.9 % IJ SOLN
INTRAMUSCULAR | Status: DC | PRN
  Administered 2020-02-03: 12 mL/h via EPIDURAL

## 2020-02-03 MED ORDER — OXYTOCIN-SODIUM CHLORIDE 30-0.9 UT/500ML-% IV SOLN: 2.5000 [IU]/h | INTRAVENOUS | Status: DC

## 2020-02-03 MED ORDER — EPHEDRINE 5 MG/ML INJ
10.0000 mg | INTRAVENOUS | Status: DC | PRN
  Administered 2020-02-04: 10 mg via INTRAVENOUS

## 2020-02-03 MED ORDER — LACTATED RINGERS IV SOLN: 500.0000 mL | Freq: Once | INTRAVENOUS | Status: DC

## 2020-02-03 MED ORDER — LIDOCAINE HCL (PF) 1 % IJ SOLN: 30.0000 mL | INTRAMUSCULAR | Status: DC | PRN

## 2020-02-03 MED ORDER — TERBUTALINE SULFATE 1 MG/ML IJ SOLN: 0.2500 mg | Freq: Once | INTRAMUSCULAR | Status: DC | PRN

## 2020-02-03 MED ORDER — OXYCODONE-ACETAMINOPHEN 5-325 MG PO TABS: 2.0000 | ORAL_TABLET | ORAL | Status: DC | PRN

## 2020-02-03 NOTE — Anesthesia Procedure Notes (Signed)
Epidural Patient location during procedure: OB Start time: 02/03/2020 6:20 PM End time: 02/03/2020 6:35 PM  Staffing Anesthesiologist: Elmer Picker, MD Performed: anesthesiologist   Preanesthetic Checklist Completed: patient identified, IV checked, risks and benefits discussed, monitors and equipment checked, pre-op evaluation and timeout performed  Epidural Patient position: sitting Prep: DuraPrep and site prepped and draped Patient monitoring: continuous pulse ox, blood pressure, heart rate and cardiac monitor Approach: midline Location: L3-L4 Injection technique: LOR air  Needle:  Needle type: Tuohy  Needle gauge: 17 G Needle length: 9 cm Needle insertion depth: 6 cm Catheter type: closed end flexible Catheter size: 19 Gauge Catheter at skin depth: 12 cm Test dose: negative  Assessment Sensory level: T8 Events: blood not aspirated, injection not painful, no injection resistance, no paresthesia and negative IV test  Additional Notes Patient identified. Risks/Benefits/Options discussed with patient including but not limited to bleeding, infection, nerve damage, paralysis, failed block, incomplete pain control, headache, blood pressure changes, nausea, vomiting, reactions to medication both or allergic, itching and postpartum back pain. Confirmed with bedside nurse the patient's most recent platelet count. Confirmed with patient that they are not currently taking any anticoagulation, have any bleeding history or any family history of bleeding disorders. Patient expressed understanding and wished to proceed. All questions were answered. Sterile technique was used throughout the entire procedure. Please see nursing notes for vital signs. Test dose was given through epidural catheter and negative prior to continuing to dose epidural or start infusion. Warning signs of high block given to the patient including shortness of breath, tingling/numbness in hands, complete motor block,  or any concerning symptoms with instructions to call for help. Patient was given instructions on fall risk and not to get out of bed. All questions and concerns addressed with instructions to call with any issues or inadequate analgesia.  Reason for block:procedure for pain

## 2020-02-03 NOTE — Progress Notes (Signed)
Kelli Mullins is a 39 y.o. [id number] at [redacted]w[redacted]d admitted for SROM at term.  Subjective: Nursing reports patient is anxious. Strip review completed.  Objective: BP 118/65   Pulse 68   Temp 98.4 F (36.9 C) (Axillary)   Resp 16   Ht 5\' 5"  (1.651 m)   Wt 107.5 kg   LMP  (LMP Unknown)   SpO2 99%   BMI 39.44 kg/m  Total I/O In: -  Out: 150 [Urine:150]  FHT:  FHR: 135 bpm, variability: moderate,  accelerations:  Present,  decelerations:  Present early, variable, and late UC:   regular, every 3-4 minutes  SVE:   Dilation: 6 Effacement (%): 90 Station: -1 Exam by:: [email or web address], RN  Pitocin @ 10 mu/min  Labs: Lab Results  Component Value Date   WBC 13.2 (H) 02/03/2020   HGB 10.5 (L) 02/03/2020   HCT 32.0 (L) 02/03/2020   MCV 97.9 02/03/2020   PLT 156 02/03/2020    Assessment / Plan: Kelli Mullins is a 39 y.o. 31 at [redacted]w[redacted]d admitted for SROM at term.  #Labor :Currently in active labor. Pitocin started 1225. IUPC in place. Amnioinfusion and LR boluses given for variable and late decels. Continue pitocin. #FWB:Category 2 reassuring for moderate variability and accels. #Pain Control:Epidural #I/D:GBS neg #Anticipated [redacted]w[redacted]d #MOF: Breast #MOC: BTL #gHTN: Patient has had 2 elevated BP readings since admission (141/86 and 136/93). CBC notable for mild thrombocytopenia at 146,CMP unremarkable.Protein-Cr ratio normal. Continue to watch BP readings.   GHW:EXHB DO PGY1, Family Medicine Resident 02/03/2020, 11:51 PM

## 2020-02-03 NOTE — Anesthesia Preprocedure Evaluation (Signed)
Anesthesia Evaluation  Patient identified by MRN, date of birth, ID band Patient awake    Reviewed: Allergy & Precautions, NPO status , Patient's Chart, lab work & pertinent test results  Airway Mallampati: III  TM Distance: >3 FB Neck ROM: Full    Dental no notable dental hx. (+) Teeth Intact, Dental Advisory Given   Pulmonary neg pulmonary ROS, Current SmokerPatient did not abstain from smoking.,    Pulmonary exam normal breath sounds clear to auscultation       Cardiovascular negative cardio ROS Normal cardiovascular exam Rhythm:Regular Rate:Normal     Neuro/Psych Seizures -, Well Controlled,  PSYCHIATRIC DISORDERS Anxiety Depression Bipolar Disorder    GI/Hepatic negative GI ROS, (+) Hepatitis -, C  Endo/Other  Morbid obesity (BMI 40)  Renal/GU negative Renal ROS  negative genitourinary   Musculoskeletal negative musculoskeletal ROS (+)   Abdominal   Peds  Hematology negative hematology ROS (+)   Anesthesia Other Findings   Reproductive/Obstetrics (+) Pregnancy                             Anesthesia Physical Anesthesia Plan  ASA: III  Anesthesia Plan: Epidural   Post-op Pain Management:    Induction:   PONV Risk Score and Plan: Treatment may vary due to age or medical condition  Airway Management Planned: Natural Airway  Additional Equipment:   Intra-op Plan:   Post-operative Plan:   Informed Consent: I have reviewed the patients History and Physical, chart, labs and discussed the procedure including the risks, benefits and alternatives for the proposed anesthesia with the patient or authorized representative who has indicated his/her understanding and acceptance.       Plan Discussed with: Anesthesiologist  Anesthesia Plan Comments: (Patient identified. Risks, benefits, options discussed with patient including but not limited to bleeding, infection, nerve damage,  paralysis, failed block, incomplete pain control, headache, blood pressure changes, nausea, vomiting, reactions to medication, itching, and post partum back pain. Confirmed with bedside nurse the patient's most recent platelet count. Confirmed with the patient that they are not taking any anticoagulation, have any bleeding history or any family history of bleeding disorders. Patient expressed understanding and wishes to proceed. All questions were answered. )        Anesthesia Quick Evaluation

## 2020-02-03 NOTE — MAU Note (Signed)
Pt reports gush of fluid at 0600, ? Leaking since then. Denies contractions. For induction tomorrow

## 2020-02-03 NOTE — Progress Notes (Signed)
Kelli Mullins is a 39 y.o. [id number] at [redacted]w[redacted]d admitted for SROM at term.  Subjective: Patient is doing well, comfortable with epidural.  Objective: BP 122/76   Pulse 71   Temp 98.4 F (36.9 C) (Axillary)   Resp 16   Ht 5\' 5"  (1.651 m)   Wt 107.5 kg   LMP  (LMP Unknown)   SpO2 99%   BMI 39.44 kg/m  Total I/O In: -  Out: 150 [Urine:150]  FHT:  FHR: 145 bpm, variability: moderate,  accelerations:  Present,  decelerations:  Present occassional variable UC:   regular, every 2-3 minutes  SVE:   Dilation: 4 Effacement (%): 90 Station: -2 Exam by:: [email or web address], MD  Pitocin @ 16 mu/min  Labs: Lab Results  Component Value Date   WBC 9.5 02/03/2020   HGB 11.4 (L) 02/03/2020   HCT 33.9 (L) 02/03/2020   MCV 98.0 02/03/2020   PLT 146 (L) 02/03/2020    Assessment / Plan: Kelli Mullins is a 39 y.o. 31 at [redacted]w[redacted]d admitted for SROM at term.  #Labor: latent early labor, Pitocin started 1225. IUPC placed at this exam. Continue to titrate pitocin to adequate contractions. #FWB:  Category 2 reassuring for moderate variability and accels. #Pain Control: Epidural #I/D:  GBS neg #Anticipated MOD:  NSVD #MOF: Breast #MOC: BTL #gHTN vs PreE: Patient has had 2 elevated BP readings since admission (141/86 and 136/93). CBC notable for mild thrombocytopenia at 146,CMP unremarkable. Will get Protein-Cr ratio. Continue to watch BP readings.   1226 DO PGY1, Family Medicine Resident 02/03/2020, 8:57 PM

## 2020-02-03 NOTE — Progress Notes (Signed)
Kelli Mullins is a 39 y.o. [id number] at [redacted]w[redacted]d admitted for SROM @ term  Subjective: Pt states she is starting to feel her ctx for the last 30 minutes; rates pain 6/10  Objective: BP 127/83   Pulse 66   Temp 97.9 F (36.6 C) (Oral)   Resp 18   Ht 5\' 5"  (1.651 m)   Wt 107.5 kg   LMP  (LMP Unknown)   SpO2 99%   BMI 39.44 kg/m  No intake/output data recorded. No intake/output data recorded.  FHT:  FHR: 140 bpm, variability: moderate,  accelerations:  Present,  decelerations:  Absent UC:   regular, every 2-3 minutes SVE:   Dilation: 3 Effacement (%): 50 Station: -3 Exam by:: [email or web address] SNM   Labs: Lab Results  Component Value Date   WBC 9.5 02/03/2020   HGB 11.4 (L) 02/03/2020   HCT 33.9 (L) 02/03/2020   MCV 98.0 02/03/2020   PLT 146 (L) 02/03/2020    Assessment / Plan: Augmentation of labor for PROM @ term  Continue pitocin Pt may have epidural when desired Anticipate NSVD  Labor: latent early labor Preeclampsia:  NA Fetal Wellbeing:  Category I Pain Control:  IV pain meds I/D:  GBS neg Anticipated MOD:  NSVD  02/05/2020 02/03/2020, 5:34 PM

## 2020-02-03 NOTE — Progress Notes (Signed)
To bedside to evaluate recurrent late decelerations. Will give additional 500cc IVF bolus. Also will reduce pitocin 16>10 as MVU's are currently ~250. BP is around her normal and does not seem to be contributing. Will continue to monitor closely.

## 2020-02-03 NOTE — MAU Provider Note (Signed)
First Provider Initiated Contact with Patient 02/03/20 1005       S: Kelli Mullins is a 39 y.o. [id number] at [redacted]w[redacted]d  who presents to MAU today complaining of leaking of fluid since 0500 this morning. She denies vaginal bleeding. She denies contractions. She reports normal fetal movement.    O: BP 127/84   Pulse 68   Temp 97.9 F (36.6 C) (Oral)   Resp 18   Ht 5\' 5"  (1.651 m)   Wt 107.5 kg   LMP  (LMP Unknown)   SpO2 99%   BMI 39.44 kg/m  GENERAL: Well-developed, well-nourished female in no acute distress.  HEAD: Normocephalic, atraumatic.  CHEST: Normal effort of breathing, regular heart rate ABDOMEN: Soft, nontender, gravid PELVIC: Normal external female genitalia. Vagina is pink and rugated. Cervix with normal contour, no lesions. Normal discharge.  Positive pooling.   Cervical exam: deferred     Fetal Monitoring: Baseline: 145 Variability: moderate Accelerations: 15x15 Decelerations: spontaneous <2 mins Contractions: irregular, pt denies   Pt informed that the ultrasound is considered a limited OB ultrasound and is not intended to be a complete ultrasound exam.  Patient also informed that the ultrasound is not being completed with the intent of assessing for fetal or placental anomalies or any pelvic abnormalities.  Explained that the purpose of today's ultrasound is to assess for  presentation.  Patient acknowledges the purpose of the exam and the limitations of the study. cephalic     Results for orders placed or performed during the hospital encounter of 02/03/20 (from the past 24 hour(s))  Fern Test     Status: Abnormal   Collection Time: 02/03/20  9:24 AM  Result Value Ref Range   POCT Fern Test Negative = intact amniotic membranes   POCT fern test     Status: Abnormal   Collection Time: 02/03/20 10:20 AM  Result Value Ref Range   POCT Fern Test Positive = ruptured amniotic membanes      A: [facility] at [redacted]w[redacted]d  SROM  P: Admit to birthing suites  [redacted]w[redacted]d, NP 02/03/2020 10:20 AM

## 2020-02-03 NOTE — H&P (Addendum)
OBSTETRIC ADMISSION HISTORY AND PHYSICAL  Kelli Mullins is a 39 y.o. female [phone] with [facility] at [redacted]w[redacted]d presenting for PROM @ term. Reports gush of clear fluid around 6am. She reports +FMs. No VB, blurry vision, headaches, peripheral edema, or RUQ pain. She plans on breastfeeding. She requests BTL for birth control.  Dating: By [redacted]w[redacted]d [place] --->  Estimated Date of Delivery: 01/29/20  Sono:    @[redacted]w[redacted]d , normal anatomy, breech presentation, EFW 888g  Prenatal History/Complications: Hx of drug use, remote Late PNC Hx of epilepsy, no meds Depression w/ anxiety Rubella non-immune Hepatitis C positive Cystic fibrosis carrier  Past Medical History: Past Medical History:  Diagnosis Date  . Anemia   . Bipolar 1 disorder (HCC)   . Depression   . History of kidney stones   . Panic attacks   . Seizure (HCC) 2019   Unknown etiology; on no meds and has had no more seizures    Past Surgical History: Past Surgical History:  Procedure Laterality Date  . DILATION AND CURETTAGE OF UTERUS N/A 10/08/2018   Procedure: CERVICAL DILATATION WITH SUCTION  AND SHARP UTERINE CURETTAGE;  Surgeon: 10/10/2018, MD;  Location: [facility];  Service: Gynecology;  Laterality: N/A;  . kidney stones      Obstetrical History: OB History    Gravida  7   Para  5   Term  5   Preterm      AB  1   Living  5     SAB  1   TAB      Ectopic      Multiple      Live Births  5           Social History: Social History   Socioeconomic History  . Marital status: Married    Spouse name: Not on file  . Number of children: Not on file  . Years of education: Not on file  . Highest education level: Not on file  Occupational History  . Not on file  Tobacco Use  . Smoking status: Current Every Day Smoker    Packs/day: 0.50    Years: 5.00    Pack years: 2.50    Types: Cigarettes  . Smokeless tobacco: Never Used  Vaping Use  . Vaping Use: Never used  Substance and Sexual Activity  . Alcohol use: No   . Drug use: Not Currently    Types: Cocaine    Comment: last used 02/2019 per pt  . Sexual activity: Yes    Birth control/protection: None  Other Topics Concern  . Not on file  Social History Narrative  . Not on file   Social Determinants of Health   Financial Resource Strain: Low Risk   . Difficulty of Paying Living Expenses: Not hard at all  Food Insecurity: No Food Insecurity  . Worried About 03/2019 in the Last Year: Never true  . Ran Out of Food in the Last Year: Never true  Transportation Needs: No Transportation Needs  . Lack of Transportation (Medical): No  . Lack of Transportation (Non-Medical): No  Physical Activity: Insufficiently Active  . Days of Exercise per Week: 2 days  . Minutes of Exercise per Session: 20 min  Stress: No Stress Concern Present  . Feeling of Stress : Not at all  Social Connections: Socially Isolated  . Frequency of Communication with Friends and Family: Once a week  . Frequency of Social Gatherings with Friends and Family: Once a week  .  Attends Religious Services: 1 to 4 times per year  . Active Member of Clubs or Organizations: No  . Attends Banker Meetings: Never  . Marital Status: Divorced    Family History: Family History  Problem Relation Age of Onset  . Diabetes Maternal Grandmother   . Cirrhosis Maternal Grandfather   . Cancer Father        prostate  . Drug abuse Mother        overdose  . 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  Flu vaccine  [place]/Hgb Elec   TDaP vaccine 4/19    Rhogam  Blood Type O/Negative/-- (04/19 1550)    Antibody Negative (04/19 1550)  Anatomy US Normal female HBsAg Negative (04/19 1550)  Feeding Plan  breast RPR Non Reactive (04/19 1550)  Contraception BTL Rubella  <0.90 (04/19 1550)  Circumcision Yes, at [facility] HIV Non Reactive (04/19 1550)  Pediatrician List given Hep C POSITIVE (new dx)  Prenatal Classes Online discussed      A1C/GTT Early:      26-28wks:normal  BTL Consent 01/04/20    VBAC Consent n/a GBS Negative/-- (06/16 1545)      [ ]  PCN allergy  Waterbirth [ ] Class [ ] Consent [ ] CNM visit      Allergies: No Known Allergies  Medications Prior to Admission  Medication Sig Dispense Refill Last Dose  . prenatal vitamin w/FE, FA (PRENATAL 1 + 1) 27-1 MG TABS tablet Take 1 tablet by mouth daily at 12 noon. 30 tablet 12 02/02/2020 at Unknown time  . Blood Pressure Monitor MISC For regular home bp monitoring during pregnancy 1 each 0     Review of Systems:  All systems reviewed and negative except as stated in HPI  PE: Blood pressure 139/76, pulse 70, temperature 98.1 F (36.7 C), temperature source Oral, resp. rate 18, height 5\' 5"  (1.651 m), weight 107.5 kg, SpO2 99 %. General appearance: alert, cooperative and no distress Lungs: regular rate and effort Heart: regular rate  Abdomen: soft, non-tender Extremities: Homans sign is negative, no sign of DVT Presentation: vertex EFM: 140 bpm, moderate variability, accels 15x15, decels none Toco: irritability   SVE: 3/50/-3, soft, posterior  Prenatal labs: ABO, Rh: --/--/PENDING (07/16 1035) Antibody: PENDING (07/16 1035) Rubella: <0.90 (04/19 1550) RPR: Non Reactive (04/19 1550)  HBsAg: Negative (04/19 1550)  HIV: Non Reactive (04/19 1550)  GBS: Negative/-- (06/16 1545)  2 hr GTT normal @ 26-28 wks  Prenatal Transfer Tool  Maternal Diabetes: No Genetic Screening: Normal Maternal Ultrasounds/Referrals: Normal Fetal Ultrasounds or other Referrals:  None Maternal Substance Abuse:  Remote hx Significant Maternal Medications:  None Significant Maternal Lab Results: Group B Strep negative, Rh negative and Other: RNI, Hep  C+  Results for orders placed or performed during the hospital encounter of 02/03/20 (from the past 24 hour(s))  Fern Test   Collection Time: 02/03/20  9:24 AM  Result Value Ref Range   POCT Fern Test Negative = intact amniotic membranes   POCT fern test   Collection Time: 02/03/20 10:20 AM  Result Value Ref Range   POCT [employer] Test  Positive = ruptured amniotic membanes   Type and screen   Collection Time: 02/03/20 10:35 AM  Result Value Ref Range   ABO/RH(D) PENDING    Antibody Screen PENDING    Sample Expiration      02/06/2020,2359 Performed at [facility], [address] [place], [place] [address]   CBC   Collection Time: 02/03/20 10:36 AM  Result Value Ref Range   WBC 9.5 4.0 - 10.5 K/uL   RBC 3.46 (L) 3.87 - 5.11 MIL/uL   Hemoglobin 11.4 (L) 12.0 - 15.0 g/dL   HCT 76.5 (L) 36 - 46 %   MCV 98.0 80.0 - 100.0 fL   MCH 32.9 26.0 - 34.0 pg   MCHC 33.6 30.0 - 36.0 g/dL   RDW 46.5 03.5 - 46.5 %   Platelets 146 (L) 150 - 400 K/uL   nRBC 0.0 0.0 - 0.2 %  Comprehensive metabolic panel   Collection Time: 02/03/20 10:36 AM  Result Value Ref Range   Sodium 135 135 - 145 mmol/L   Potassium 4.0 3.5 - 5.1 mmol/L   Chloride 108 98 - 111 mmol/L   CO2 19 (L) 22 - 32 mmol/L   Glucose, Bld 88 70 - 99 mg/dL   BUN 9 6 - 20 mg/dL   Creatinine, Ser 6.81 0.44 - 1.00 mg/dL   Calcium 8.4 (L) 8.9 - 10.3 mg/dL   Total Protein 6.6 6.5 - 8.1 g/dL   Albumin 3.0 (L) 3.5 - 5.0 g/dL   AST 17 15 - 41 U/L   ALT 12 0 - 44 U/L   Alkaline Phosphatase 131 (H) 38 - 126 U/L   Total Bilirubin 0.5 0.3 - 1.2 mg/dL   GFR calc non Af Amer >60 >60 mL/min   GFR calc Af Amer >60 >60 mL/min   Anion gap 8 5 - 15    Patient Active Problem List   Diagnosis Date Noted  . Post term pregnancy at [redacted] weeks gestation 02/03/2020  . Encounter for supervision of other normal pregnancy, third trimester 12/20/2019  . Cystic fibrosis carrier 11/21/2019  . Abnormal Pap smear of cervix 11/14/2019  . Rubella  non-immune status, antepartum 11/08/2019  . Hepatitis C 11/08/2019  . Encounter for supervision of other normal pregnancy, unspecified trimester 11/07/2019  . Late prenatal care in second trimester 11/07/2019  . History of epilepsy 11/07/2019  . Depression with anxiety 11/07/2019  . History of drug use 08/19/2018    Assessment: Kelli Mullins is a 39 y.o. [id number] at [redacted]w[redacted]d here for PROM @ term  1. Labor: IOL for PROM @ term 2. FWB: Cat I 3. Pain: epidural @ request 4. GBS: neg   Plan: Admit to L&D IOL pitocin Anticipate NSVD  Lelon Mast, Student-MidWife  02/03/2020, 11:57 AM   Midwife attestation: I have seen and examined this patient; I agree with above documentation in the student's note.   PE: Gen: calm comfortable, NAD Resp: normal effort and rate Abd: gravid  ROS, labs, PMH reviewed  Assessment/Plan: [redacted] weeks gestation Postdates  PROM at term Labor: latent FWB: Cat I GBS: neg Admit to LD  Discussed IOL vs expectant mnt, pt prefers IOL, plan for Pitocin Anticipate SVD TXA at delivery d/t grand multip  Donette Larry, CNM  02/03/2020, 5:02 PM

## 2020-02-04 ENCOUNTER — Encounter (HOSPITAL_COMMUNITY)
Admission: AD | Disposition: A | Discharge status: Home / Self Care | Payer: Self-pay | Source: Home / Self Care | Attending: Obstetrics & Gynecology

## 2020-02-04 ENCOUNTER — Inpatient Hospital Stay (HOSPITAL_COMMUNITY): Payer: Medicaid Other | Admitting: Anesthesiology

## 2020-02-04 ENCOUNTER — Encounter (HOSPITAL_COMMUNITY): Payer: Self-pay | Admitting: Obstetrics & Gynecology

## 2020-02-04 DIAGNOSIS — Z302 Encounter for sterilization: Secondary | ICD-10-CM

## 2020-02-04 DIAGNOSIS — Z3A40 40 weeks gestation of pregnancy: Secondary | ICD-10-CM

## 2020-02-04 DIAGNOSIS — O4212 Full-term premature rupture of membranes, onset of labor more than 24 hours following rupture: Secondary | ICD-10-CM

## 2020-02-04 HISTORY — PX: TUBAL LIGATION: SHX77

## 2020-02-04 SURGERY — LIGATION, FALLOPIAN TUBE, POSTPARTUM
Anesthesia: Spinal | Laterality: Bilateral

## 2020-02-04 MED ORDER — DIBUCAINE (PERIANAL) 1 % EX OINT: 1.0000 "application " | TOPICAL_OINTMENT | CUTANEOUS | Status: DC | PRN

## 2020-02-04 MED ORDER — FENTANYL CITRATE (PF) 100 MCG/2ML IJ SOLN: 25.0000 ug | INTRAMUSCULAR | Status: DC | PRN

## 2020-02-04 MED ORDER — ONDANSETRON HCL 4 MG/2ML IJ SOLN: 4.0000 mg | Freq: Once | INTRAMUSCULAR | Status: DC | PRN

## 2020-02-04 MED ORDER — DOCUSATE SODIUM 100 MG PO CAPS
100.0000 mg | ORAL_CAPSULE | Freq: Two times a day (BID) | ORAL | Status: DC
  Administered 2020-02-04 – 2020-02-06 (×4): 100 mg via ORAL
  Filled 2020-02-04 (×4): qty 1

## 2020-02-04 MED ORDER — BUPIVACAINE HCL (PF) 0.5 % IJ SOLN
INTRAMUSCULAR | Status: AC
  Filled 2020-02-04: qty 30

## 2020-02-04 MED ORDER — ONDANSETRON HCL 4 MG PO TABS: 4.0000 mg | ORAL_TABLET | ORAL | Status: DC | PRN

## 2020-02-04 MED ORDER — SIMETHICONE 80 MG PO CHEW: 80.0000 mg | CHEWABLE_TABLET | ORAL | Status: DC | PRN

## 2020-02-04 MED ORDER — COCONUT OIL OIL: 1.0000 "application " | TOPICAL_OIL | Status: DC | PRN

## 2020-02-04 MED ORDER — FENTANYL CITRATE (PF) 100 MCG/2ML IJ SOLN
INTRAMUSCULAR | Status: DC | PRN
  Administered 2020-02-04 (×2): 50 ug via INTRAVENOUS

## 2020-02-04 MED ORDER — PRENATAL MULTIVITAMIN CH
1.0000 | ORAL_TABLET | Freq: Every day | ORAL | Status: DC
  Administered 2020-02-05 – 2020-02-06 (×2): 1 via ORAL
  Filled 2020-02-04 (×2): qty 1

## 2020-02-04 MED ORDER — MEASLES, MUMPS & RUBELLA VAC IJ SOLR
0.5000 mL | Freq: Once | INTRAMUSCULAR | Status: AC
  Administered 2020-02-05: 0.5 mL via SUBCUTANEOUS
  Filled 2020-02-04: qty 0.5

## 2020-02-04 MED ORDER — ONDANSETRON HCL 4 MG/2ML IJ SOLN
INTRAMUSCULAR | Status: DC | PRN
  Administered 2020-02-04: 4 mg via INTRAVENOUS

## 2020-02-04 MED ORDER — ONDANSETRON HCL 4 MG/2ML IJ SOLN: 4.0000 mg | INTRAMUSCULAR | Status: DC | PRN

## 2020-02-04 MED ORDER — DEXMEDETOMIDINE HCL 200 MCG/2ML IV SOLN
INTRAVENOUS | Status: DC | PRN
  Administered 2020-02-04 (×2): 4 ug via INTRAVENOUS

## 2020-02-04 MED ORDER — OXYCODONE HCL 5 MG PO TABS
10.0000 mg | ORAL_TABLET | ORAL | Status: DC | PRN
  Administered 2020-02-04 – 2020-02-05 (×2): 10 mg via ORAL
  Filled 2020-02-04 (×2): qty 2

## 2020-02-04 MED ORDER — MIDAZOLAM HCL 5 MG/5ML IJ SOLN
INTRAMUSCULAR | Status: DC | PRN
Start: 2020-02-04 — End: 2020-02-04
  Administered 2020-02-04 (×2): 1 mg via INTRAVENOUS

## 2020-02-04 MED ORDER — BUPIVACAINE HCL (PF) 0.5 % IJ SOLN
INTRAMUSCULAR | Status: DC | PRN
  Administered 2020-02-04: 30 mL

## 2020-02-04 MED ORDER — BUPIVACAINE IN DEXTROSE 0.75-8.25 % IT SOLN
INTRATHECAL | Status: DC | PRN
  Administered 2020-02-04: 1.8 mL via INTRATHECAL

## 2020-02-04 MED ORDER — LACTATED RINGERS IV SOLN: INTRAVENOUS | Status: DC

## 2020-02-04 MED ORDER — DIPHENHYDRAMINE HCL 25 MG PO CAPS: 25.0000 mg | ORAL_CAPSULE | Freq: Four times a day (QID) | ORAL | Status: DC | PRN

## 2020-02-04 MED ORDER — SENNOSIDES-DOCUSATE SODIUM 8.6-50 MG PO TABS
2.0000 | ORAL_TABLET | ORAL | Status: DC
  Administered 2020-02-05 – 2020-02-06 (×2): 2 via ORAL
  Filled 2020-02-04 (×2): qty 2

## 2020-02-04 MED ORDER — RHO D IMMUNE GLOBULIN 1500 UNIT/2ML IJ SOSY
300.0000 ug | PREFILLED_SYRINGE | Freq: Once | INTRAMUSCULAR | Status: AC | PRN
  Administered 2020-02-05: 300 ug via INTRAMUSCULAR
  Filled 2020-02-04: qty 2

## 2020-02-04 MED ORDER — MISOPROSTOL 25 MCG QUARTER TABLET
25.0000 ug | ORAL_TABLET | ORAL | Status: DC | PRN
  Filled 2020-02-04: qty 1

## 2020-02-04 MED ORDER — ZOLPIDEM TARTRATE 5 MG PO TABS: 5.0000 mg | ORAL_TABLET | Freq: Every evening | ORAL | Status: DC | PRN

## 2020-02-04 MED ORDER — BENZOCAINE-MENTHOL 20-0.5 % EX AERO
1.0000 "application " | INHALATION_SPRAY | CUTANEOUS | Status: DC | PRN
  Administered 2020-02-04: 1 via TOPICAL
  Filled 2020-02-04: qty 56

## 2020-02-04 MED ORDER — WITCH HAZEL-GLYCERIN EX PADS: 1.0000 "application " | MEDICATED_PAD | CUTANEOUS | Status: DC | PRN

## 2020-02-04 MED ORDER — PNEUMOCOCCAL VAC POLYVALENT 25 MCG/0.5ML IJ INJ
0.5000 mL | INJECTION | INTRAMUSCULAR | Status: AC
  Administered 2020-02-05: 0.5 mL via INTRAMUSCULAR
  Filled 2020-02-04: qty 0.5

## 2020-02-04 MED ORDER — ACETAMINOPHEN 325 MG PO TABS
650.0000 mg | ORAL_TABLET | ORAL | Status: DC | PRN
  Administered 2020-02-04 – 2020-02-06 (×8): 650 mg via ORAL
  Filled 2020-02-04 (×8): qty 2

## 2020-02-04 MED ORDER — TETANUS-DIPHTH-ACELL PERTUSSIS 5-2.5-18.5 LF-MCG/0.5 IM SUSP: 0.5000 mL | Freq: Once | INTRAMUSCULAR | Status: DC

## 2020-02-04 MED ORDER — OXYCODONE HCL 5 MG PO TABS
5.0000 mg | ORAL_TABLET | ORAL | Status: DC | PRN
  Administered 2020-02-04 – 2020-02-06 (×6): 5 mg via ORAL
  Filled 2020-02-04 (×6): qty 1

## 2020-02-04 MED ORDER — IBUPROFEN 600 MG PO TABS
600.0000 mg | ORAL_TABLET | Freq: Four times a day (QID) | ORAL | Status: DC
  Administered 2020-02-04 – 2020-02-06 (×8): 600 mg via ORAL
  Filled 2020-02-04 (×8): qty 1

## 2020-02-04 MED ORDER — LACTATED RINGERS IV SOLN: INTRAVENOUS | Status: DC | PRN

## 2020-02-04 MED ORDER — SOD CITRATE-CITRIC ACID 500-334 MG/5ML PO SOLN: 30.0000 mL | Freq: Once | ORAL | Status: DC

## 2020-02-04 SURGICAL SUPPLY — 28 items
BENZOIN TINCTURE PRP APPL 2/3 (GAUZE/BANDAGES/DRESSINGS) IMPLANT
BLADE SURG 11 STRL SS (BLADE) ×2 IMPLANT
CLOTH BEACON ORANGE TIMEOUT ST (SAFETY) ×2 IMPLANT
DRSG OPSITE POSTOP 3X4 (GAUZE/BANDAGES/DRESSINGS) ×2 IMPLANT
DURAPREP 26ML APPLICATOR (WOUND CARE) ×2 IMPLANT
ELECT REM PT RETURN 9FT ADLT (ELECTROSURGICAL) ×2
ELECTRODE REM PT RTRN 9FT ADLT (ELECTROSURGICAL) ×1 IMPLANT
GLOVE BIOGEL PI IND STRL 6 (GLOVE) ×1 IMPLANT
GLOVE BIOGEL PI IND STRL 7.0 (GLOVE) ×3 IMPLANT
GLOVE BIOGEL PI INDICATOR 6 (GLOVE) ×1
GLOVE BIOGEL PI INDICATOR 7.0 (GLOVE) ×3
GLOVE ECLIPSE 7.0 STRL STRAW (GLOVE) ×2 IMPLANT
GOWN STRL REUS W/TWL LRG LVL3 (GOWN DISPOSABLE) ×4 IMPLANT
NEEDLE HYPO 22GX1.5 SAFETY (NEEDLE) ×2 IMPLANT
NS IRRIG 1000ML POUR BTL (IV SOLUTION) ×2 IMPLANT
PACK ABDOMINAL MINOR (CUSTOM PROCEDURE TRAY) ×2 IMPLANT
PENCIL BUTTON HOLSTER BLD 10FT (ELECTRODE) ×2 IMPLANT
PROTECTOR NERVE ULNAR (MISCELLANEOUS) ×2 IMPLANT
SPONGE LAP 4X18 RFD (DISPOSABLE) IMPLANT
SUT VIC AB 0 CT1 27 (SUTURE) ×1
SUT VIC AB 0 CT1 27XBRD ANBCTR (SUTURE) ×1 IMPLANT
SUT VIC AB 2-0 CT1 27 (SUTURE) ×2
SUT VIC AB 2-0 CT1 TAPERPNT 27 (SUTURE) ×2 IMPLANT
SUT VICRYL 4-0 PS2 18IN ABS (SUTURE) ×2 IMPLANT
SYR CONTROL 10ML LL (SYRINGE) ×2 IMPLANT
TOWEL OR 17X24 6PK STRL BLUE (TOWEL DISPOSABLE) ×4 IMPLANT
TRAY FOLEY CATH SILVER 14FR (SET/KITS/TRAYS/PACK) ×2 IMPLANT
WATER STERILE IRR 1000ML POUR (IV SOLUTION) ×2 IMPLANT

## 2020-02-04 NOTE — Progress Notes (Signed)
Kelli Mullins is a 39 y.o. [id number] at [redacted]w[redacted]d admitted for rupture of membranes  Subjective: Pt comfortable with epidural. S/O in room for support.  Objective: BP 97/68   Pulse 64   Temp 98.1 F (36.7 C)   Resp 16   Ht 5\' 5"  (1.651 m)   Wt 107.5 kg   LMP  (LMP Unknown)   SpO2 100%   Breastfeeding Unknown   BMI 39.44 kg/m  I/O last 3 completed shifts: In: -  Out: 1100 [Urine:1100] Total I/O In: 1000 [I.V.:1000] Out: 1535 [Urine:1450; Blood:85]  FHT:  FHR: 125 bpm, variability: moderate,  accelerations:  Present,  decelerations:  Present occasional variables UC:   regular, every 2 minutes SVE:   9.5/100/0 by Dr 02-18-1967. Manual flexion of fetal head and rotation of fetus during exam by Dr Morene Antu.  Gush of clear fluid during exam. Pt tolerated well.  Labs: Lab Results  Component Value Date   WBC 13.2 (H) 02/03/2020   HGB 10.5 (L) 02/03/2020   HCT 32.0 (L) 02/03/2020   MCV 97.9 02/03/2020   PLT 156 02/03/2020    Assessment / Plan: Augmentation of labor, progressing well  Labor: Arrest in active labor due to fetal malposition, fetus repositioned manually by physician.  Will conitinue to increase Pitocin for adequate contractions. Preeclampsia:  n/a Fetal Wellbeing:  Category II Pain Control:  Epidural I/D:  GBS neg Anticipated MOD:  NSVD  02/05/2020 02/04/2020, 4:25 PM

## 2020-02-04 NOTE — Progress Notes (Addendum)
   02/04/20 1701  Psychosocial  Psychosocial (WDL) WDL  Patient Behaviors Cooperative (impusive. pt was up trying to walk to bathroom when rn enter)  blood dripping down her leg while scd's still attached to both legs. Pt st "I was trying to get a pair of underwear. They didn't put any on me".  RN asked pt to return to bed and pt did. RN asst pt to bathroom where the pt repeatedly stood up while RN was helping her change her pad/ peri care.

## 2020-02-04 NOTE — Op Note (Signed)
Kelli Mullins 02/03/2020 - 02/04/2020  PREOPERATIVE DIAGNOSES: Multiparity, undesired fertility  POSTOPERATIVE DIAGNOSES: Multiparity, undesired fertility  PROCEDURE:  Postpartum Bilateral Salpingectomy  SURGEONS: Dr.  Jaynie Collins and Dr. Jerilynn Birkenhead  ANESTHESIA: Spinal and local analgesia using 30 ml of 0.5% Marcaine  COMPLICATIONS:  None immediate.  ESTIMATED BLOOD LOSS: 10 ml.  INDICATIONS:  39 y.o. [id number] with undesired fertility, status post vaginal delivery, desires permanent sterilization.  Other reversible forms of contraception were discussed with patient; she declines all other modalities. Risks of procedure discussed with patient including but not limited to: risk of regret, permanence of method, bleeding, infection, injury to surrounding organs and need for additional procedures.  Failure risk of 1% with increased risk of ectopic gestation if pregnancy occurs and possibility of post-tubal pain syndrome also discussed with patient.      FINDINGS:  Normal uterus, tubes, and ovaries. Excised fallopian tubes were sent to pathology.  PROCEDURE DETAILS: The patient was taken to the operating room where spinal anesthesia was adminsitered and found to be adequate.  She was then placed in the dorsal supine position and prepped and draped in sterile fashion.  After an adequate timeout was performed, attention was turned to the patient's abdomen where a small transverse skin incision was made under the umbilical fold. The incision was taken down to the layer of fascia using the scalpel, and fascia was incised, and extended bilaterally using Mayo scissors. The peritoneum was entered in a sharp fashion.  Attention was then turned to the patient's uterus, and left fallopian tube was then identified, and the Babcock clamp was then used to grasp the tube. Kelly forceps were placed on the distal portion of the mesosalpinx underneath about 80-90% of the tube.  This pedicle was double suture ligated  with 2-0 Vicryl, and this large portion of the tube including the fimbriated end was excised.  The right fallopian tube was then identified, doubly ligated, excised in a similar fashion allowing for bilateral salpingectomy.   Good hemostasis was noted overall.  The instruments were then removed from the patient's abdomen and the fascial incision was repaired with 0 Vicryl, and the skin was closed with a 4-0 Vicryl subcuticular stitch. The patient tolerated the procedure well.  Instrument, sponge, and needle counts were correct times three.  The patient was then taken to the recovery room awake and in stable condition.   Jaynie Collins, MD, FACOG Obstetrician & Gynecologist, [facility] for [employer], [facility]

## 2020-02-04 NOTE — Progress Notes (Signed)
Kelli Mullins is a 39 y.o. H4V4259 at [redacted]w[redacted]d admitted for SROM at term.  Subjective: Nursing reports patient is anxious. Strip review completed.  Objective: BP 105/84   Pulse (!) 58   Temp 97.7 F (36.5 C)   Resp 16   Ht 5\' 5"  (1.651 m)   Wt 107.5 kg   LMP  (LMP Unknown)   SpO2 99%   BMI 39.44 kg/m  Total I/O In: -  Out: 150 [Urine:150]  FHT:  FHR: 130 bpm, variability: moderate,  accelerations:  Present,  decelerations:  Present early, variable, prolonged UC:   regular, q4 minutes  SVE:   Dilation: 8 Effacement (%): 90 Station: -1 Exam by:: Saleh Ulbrich  Pitocin off  Labs: Lab Results  Component Value Date   WBC 13.2 (H) 02/03/2020   HGB 10.5 (L) 02/03/2020   HCT 32.0 (L) 02/03/2020   MCV 97.9 02/03/2020   PLT 156 02/03/2020    Assessment / Plan: Kelli Mullins is a 39 y.o. 31 at [redacted]w[redacted]d admitted for SROM at term.  #Labor : SROM at 0600. Pitocin started 1225. IUPC in place. Frequent difficulties with tracing, called to bedside for prolonged decel, likely secondary to hypotension, appreciate anesthesia at bedside for pressors with significant improvement in BP. Pitocin also stopped. FHR now recovered though contractions are spacing, will allow ~2min of rest and then resume pitocin if contractions remain spaced. #FWB:Category 2 reassuring for moderate variability and accels. #Pain Control:Epidural #I/D:GBS neg #Anticipated 31m #MOF: Breast #MOC: BTL #gHTN: Patient has had 2 elevated BP readings since admission (141/86 and 136/93). CBC notable for mild thrombocytopenia at 146 though now recovered to 156,CMP unremarkable.Protein-Cr ratio normal. Continue to watch BP readings.   EPP:IRJJ MD OB Fellow 02/04/2020, 2:49 AM

## 2020-02-04 NOTE — Progress Notes (Addendum)
Kelli Mullins is a 39 y.o. T7D2202 at [redacted]w[redacted]d for PROM @ term.  Subjective: Pt concerned about length of labor, but willing to try longer w/ position changes & pitocin. Significant other @ BS for support.  Objective: BP 119/62   Pulse 74   Temp (!) 97.5 F (36.4 C)   Resp 18   Ht 5\' 5"  (1.651 m)   Wt 107.5 kg   LMP  (LMP Unknown)   SpO2 99%   BMI 39.44 kg/m  I/O last 3 completed shifts: In: -  Out: 1100 [Urine:1100]   FHT:  FHR: 130 bpm, variability: moderate,  accelerations:  Present,  decelerations:  Absent UC:   irregular, every 2-5 minutes SVE:   Dilation: 8 Effacement (%): 80 Station: -1 Exam by:: [email or web address] RN  Labs: Lab Results  Component Value Date   WBC 13.2 (H) 02/03/2020   HGB 10.5 (L) 02/03/2020   HCT 32.0 (L) 02/03/2020   MCV 97.9 02/03/2020   PLT 156 02/03/2020    Assessment / Plan: IOL w/ pitocin for PROM @ term   Extended& OP fetal presentation as per bedside 02/05/2020 [place] position changes Continue pit as needed per protocol Active mgmt Anticipate NSVD   Labor:  progressing slowly Preeclampsia:   NA Fetal Wellbeing:  Category I Pain Control:  Epidural I/D:   GBS neg Anticipated MOD:  NSVD  [employer] 02/04/2020, 8:47 AM   Midwife attestation: I have seen and examined this patient; I agree with above documentation in the midwife student's note.   Kelli Mullins is a 40 y.o. [phone] here for IOL for PROM.  PE: BP 97/68   Pulse 64   Temp 98.1 F (36.7 C)   Resp 16   Ht 5\' 5"  (1.651 m)   Wt 237 lb (107.5 kg)   LMP  (LMP Unknown)   SpO2 100%   Breastfeeding Unknown   BMI 39.44 kg/m  Gen: calm comfortable, NAD Resp: normal effort, no distress Abd: gravid  ROS, labs, PMH reviewed  Plan: Admit to LD Labor: Malpositioned fetus, maternal position changes to improve fetal position. If not effective, MD to attempt manual rotation/flexion of fetal head Fetal monitoring: Category I ID: GBS neg  [id number], CNM   02/04/2020, 4:19 PM

## 2020-02-04 NOTE — Transfer of Care (Signed)
Immediate Anesthesia Transfer of Care Note  Patient: Kelli Mullins  Procedure(s) Performed: POST PARTUM TUBAL LIGATION (Bilateral )  Patient Location: PACU  Anesthesia Type:Spinal  Level of Consciousness: awake, alert  and oriented  Airway & Oxygen Therapy: Patient Spontanous Breathing  Post-op Assessment: Report given to RN and Post -op Vital signs reviewed and stable  Post vital signs: Reviewed and stable  Last Vitals:  Vitals Value Taken Time  BP    Temp    Pulse    Resp    SpO2      Last Pain:  Vitals:   02/04/20 1225  TempSrc: Oral  PainSc: 0-No pain         Complications: No complications documented.

## 2020-02-04 NOTE — Discharge Summary (Signed)
Postpartum Discharge Summary    Patient Name: Kelli Mullins DOB: November 16, 1980 MRN: [id number]  Date of admission: 02/03/2020 Delivery date:02/04/2020  Delivering provider: Aldona Lento E  Date of discharge: 02/06/2020  Admitting diagnosis: Post term pregnancy at [redacted] weeks gestation [O48.0, Z3A.41] Intrauterine pregnancy: [redacted]w[redacted]d    Secondary diagnosis:  Active Problems:   Post term pregnancy at 475weeks gestation   NSVD (normal spontaneous vaginal delivery)  Additional problems:     Discharge diagnosis: Term Pregnancy Delivered                                              Post partum procedures:postpartum bilateral salpingectomy Augmentation: Pitocin and Cytotec Complications: None  Hospital course: Onset of Labor With Vaginal Delivery      39 y.o. yo [id number] [id number] admitted in Latent Labor on 02/03/2020. Patient had an uncomplicated labor course as follows:  Membrane Rupture Time/Date: 6:00 AM ,02/03/2020   Delivery Method:Vaginal, Spontaneous  Episiotomy: None  Lacerations:  Perineal  Patient had an uncomplicated postpartum course. She had a PP bilateral salpingectomy. She is ambulating, tolerating a regular diet, passing flatus, and urinating well. Patient is discharged home in stable condition on 02/06/20.  Newborn Data: Birth date:02/04/2020  Birth time:11:24 AM  Gender:Female  Living status:Living  Apgars:8 ,9  Weight:4245 g   Magnesium Sulfate received: No BMZ received: No Rhophylac:Yes MMR:Yes T-DaP:Given prenatally Flu: No Transfusion:No  Physical exam  Vitals:   02/04/20 2226 02/05/20 0204 02/05/20 1324 02/05/20 2117  BP: 109/72 103/67 120/88 119/73  Pulse: 70 65 (!) 57 73  Resp: _0 Temp:  98.4 F (36.9 C) 97.7 F (36.5 C) 98.9 F (37.2 C)  TempSrc:  Oral Oral Oral  SpO2:   99% 99%  Weight:      Height:       General: alert, cooperative and no distress Lochia: appropriate Uterine Fundus: firm Incision: Healing well with no significant  drainage, No significant erythema, Dressing is clean, dry, and intact DVT Evaluation: No evidence of DVT seen on physical exam. No significant calf/ankle edema. Labs: Lab Results  Component Value Date   WBC 12.7 (H) 02/05/2020   HGB 9.6 (L) 02/05/2020   HCT 29.0 (L) 02/05/2020   MCV 99.0 02/05/2020   PLT 145 (L) 02/05/2020   CMP Latest Ref Rng & Units 02/03/2020  Glucose 70 - 99 mg/dL 88  BUN 6 - 20 mg/dL 9  Creatinine 0.44 - 1.00 mg/dL 0.50  Sodium 135 - 145 mmol/L 135  Potassium 3.5 - 5.1 mmol/L 4.0  Chloride 98 - 111 mmol/L 108  CO2 22 - 32 mmol/L 19(L)  Calcium 8.9 - 10.3 mg/dL 8.4(L)  Total Protein 6.5 - 8.1 g/dL 6.6  Total Bilirubin 0.3 - 1.2 mg/dL 0.5  Alkaline Phos 38 - 126 U/L 131(H)  AST 15 - 41 U/L 17  ALT 0 - 44 U/L 12   Edinburgh Score: Edinburgh Postnatal Depression Scale Screening Tool 02/04/2020  I have been able to laugh and see the funny side of things. 0  I have looked forward with enjoyment to things. 0  I have blamed myself unnecessarily when things went wrong. 2  I have been anxious or worried for no good reason. 0  I have felt scared or panicky for no good reason. 0  Things have been getting on top of me.  0  I have been so unhappy that I have had difficulty sleeping. 0  I have felt sad or miserable. 0  I have been so unhappy that I have been crying. 0  The thought of harming myself has occurred to me. 0  Edinburgh Postnatal Depression Scale Total 2     After visit meds:  Allergies as of 02/06/2020   No Known Allergies     Medication List    TAKE these medications   acetaminophen 325 MG tablet Commonly known as: Tylenol Take 2 tablets (650 mg total) by mouth every 4 (four) hours as needed (for pain scale < 4).   Blood Pressure Monitor Misc For regular home bp monitoring during pregnancy   ferrous sulfate 325 (65 FE) MG tablet Take 1 tablet (325 mg total) by mouth every other day. Start taking on: February 07, 2020   ibuprofen 600 MG  tablet Commonly known as: ADVIL Take 1 tablet (600 mg total) by mouth every 6 (six) hours.   oxyCODONE 5 MG immediate release tablet Commonly known as: Oxy IR/ROXICODONE Take 1 tablet (5 mg total) by mouth every 4 (four) hours as needed (pain scale 4-7).   polyethylene glycol powder 17 GM/SCOOP powder Commonly known as: GLYCOLAX/MIRALAX Take 17 g by mouth daily as needed.   prenatal vitamin w/FE, FA 27-1 MG Tabs tablet Take 1 tablet by mouth daily at 12 noon.        Discharge home in stable condition Infant Feeding: Breast Infant Disposition:home with mother Discharge instruction: per After Visit Summary and Postpartum booklet. Activity: Advance as tolerated. Pelvic rest for 6 weeks.  Diet: routine diet Future Appointments: Future Appointments  Date Time Provider [place]  03/13/2020  1:30 PM Florian Buff, MD [facility]   Follow up Visit:   Please schedule this patient for a Virtual postpartum visit in 4 weeks with the following provider: Any provider. Additional Postpartum F/U:n/a  Low risk pregnancy complicated by: anxiety/depression/Hep C Delivery mode:  Vaginal, Spontaneous  Anticipated Birth Control:  BTL done [facility]   02/06/2020 Clarnce Flock, MD

## 2020-02-04 NOTE — Anesthesia Procedure Notes (Signed)
Spinal  Patient location during procedure: OR Start time: 02/04/2020 12:43 PM End time: 02/04/2020 12:47 PM Staffing Performed: anesthesiologist  Anesthesiologist: Mal Amabile, MD Preanesthetic Checklist Completed: patient identified, IV checked, site marked, risks and benefits discussed, surgical consent, monitors and equipment checked, pre-op evaluation and timeout performed Spinal Block Patient position: sitting Prep: DuraPrep and site prepped and draped Patient monitoring: heart rate, cardiac monitor, continuous pulse ox and blood pressure Approach: midline Location: L3-4 Injection technique: single-shot Needle Needle type: Pencan  Needle gauge: 24 G Needle length: 9 cm Needle insertion depth: 7 cm Assessment Sensory level: T4 Additional Notes Patient tolerated procedure well. Adequate sensory level.

## 2020-02-04 NOTE — Anesthesia Postprocedure Evaluation (Signed)
Anesthesia Post Note  Patient: Kelli Mullins  Procedure(s) Performed: AN AD HOC LABOR EPIDURAL     Patient location during evaluation: Mother Baby Anesthesia Type: Epidural Level of consciousness: awake and alert Pain management: pain level controlled Vital Signs Assessment: post-procedure vital signs reviewed and stable Respiratory status: spontaneous breathing, nonlabored ventilation and respiratory function stable Cardiovascular status: stable Postop Assessment: no headache, no backache and epidural receding Anesthetic complications: no   No complications documented.  Last Vitals:  Vitals:   02/04/20 1541 02/04/20 1700  BP: 97/68 108/86  Pulse: 64 72  Resp: 16 17  Temp: 36.7 C 36.7 C  SpO2: 100% 100%    Last Pain:  Vitals:   02/04/20 1701  TempSrc:   PainSc: 7    Pain Goal:                   Trellis Paganini

## 2020-02-04 NOTE — Progress Notes (Signed)
    Faculty Practice OB/GYN Attending Note  39 y.o. [id number] s/p recent vaginal delivery at [redacted]w[redacted]d who desires permanent sterilization. Medicaid papers had been signed on 01/04/2020.  Other reversible forms of contraception including more effective LARCs such as IUD or Nexplanon were discussed with patient; she declines all other modalities. Her FOB also declined vasectomy, after being counseled about this procedure being less invasive and more effective.   Details of postpartum tubal sterilization discussed in detail.   She was told that this will be performed as either salpingectomy or occlusion with Filshie clips, depending on difficulty of procedure, exposure and other factors.  Risks of procedure discussed with patient including but not limited to: risk of regret, permanence of method, bleeding, infection, injury to surrounding organs and need for additional procedures.  Failure risk of about 1 % with increased risk of ectopic gestation if pregnancy occurs was also discussed with patient.  Also discussed possibility of post-tubal pain syndrome. Patient verbalized understanding of these risks and wants to proceed with sterilization.  Written informed consent obtained.  Procedure has been scheduled for 1230 today.  Patient understands that her procedure may be delayed by any cases coming from L&D or MAU, or any other urgent events in [facility] as this is an elective procedure.  Will continue close observation and postpartum care as ordered. To OR when ready.    Jaynie Collins, MD, FACOG Obstetrician & Gynecologist, [facility] for [employer], [facility]

## 2020-02-04 NOTE — Progress Notes (Signed)
Kelli Mullins is a 39 y.o. N2D7824 at [redacted]w[redacted]d admitted for SROM at term.  Subjective: Patient comfortable w epidural  Objective: BP 126/78   Pulse 68   Temp 97.8 F (36.6 C) (Oral)   Resp 16   Ht 5\' 5"  (1.651 m)   Wt 107.5 kg   LMP  (LMP Unknown)   SpO2 98%   BMI 39.44 kg/m  Total I/O In: -  Out: 750 [Urine:750]  FHT:  FHR: 150 bpm, variability: moderate,  accelerations:  absent,  decelerations:  Present early, variable, prolonged UC:   regular, q3-4 minutes  SVE:   Dilation: 8 Effacement (%): 90 Station: -1 Exam by:: Jamesmichael Shadd  Pitocin off  Labs: Lab Results  Component Value Date   WBC 13.2 (H) 02/03/2020   HGB 10.5 (L) 02/03/2020   HCT 32.0 (L) 02/03/2020   MCV 97.9 02/03/2020   PLT 156 02/03/2020    Assessment / Plan: Kelli Mullins is a 39 y.o. 31 at [redacted]w[redacted]d admitted for SROM at term.  #Labor : SROM at 0600. Pitocin started 1225. IUPC in place. Had decel around 0330 at which point pitocin was stopped, called back to room at ~0500 for another decel which resolved with position changes and stopping pitocin as well. At this point has been on pitocin for almost 18 hours but also close to 24h ROM. On SVE some concern for swelling of cervix and possible malpositioning of infant. Will give benadryl 12.5mg  IV and two hour pitocin break. Discussed with patient that currently both she and baby are recovered so reasonable to continue to attempt SVD given grand multiparity and absence of other complicating factors. Will cont frequent position changes.  #FWB:Category 2 reassuring for moderate variability. #Pain Control:Epidural #I/D:GBS neg #Anticipated [redacted]w[redacted]d #MOF: Breast #MOC: BTL #gHTN: Patient has had 2 elevated BP readings since admission (141/86 and 136/93). CBC notable for mild thrombocytopenia at 146 though now recovered to 156,CMP unremarkable.Protein-Cr ratio normal. Continue to watch BP readings.   ERX:VQMG MD OB Fellow 02/04/2020, 5:17 AM

## 2020-02-04 NOTE — Anesthesia Postprocedure Evaluation (Signed)
Anesthesia Post Note  Patient: Kelli Mullins  Procedure(s) Performed: POST PARTUM TUBAL LIGATION (Bilateral )     Patient location during evaluation: PACU Anesthesia Type: Spinal Level of consciousness: oriented and awake and alert Pain management: pain level controlled Vital Signs Assessment: post-procedure vital signs reviewed and stable Respiratory status: spontaneous breathing, respiratory function stable and nonlabored ventilation Cardiovascular status: blood pressure returned to baseline and stable Postop Assessment: no headache, no backache, no apparent nausea or vomiting, spinal receding and patient able to bend at knees Anesthetic complications: no   No complications documented.  Last Vitals:  Vitals:   02/04/20 1417 02/04/20 1431  BP: (!) 100/54 106/62  Pulse: 60   Resp: 15   Temp:    SpO2: 98%     Last Pain:  Vitals:   02/04/20 1431  TempSrc:   PainSc: 0-No pain   Pain Goal:    LLE Motor Response: No movement due to regional block (02/04/20 1350) LLE Sensation: No sensation (absent) (02/04/20 1350) RLE Motor Response: No movement due to regional block (02/04/20 1350) RLE Sensation: No sensation (absent) (02/04/20 1350)        Sydelle Sherfield A.

## 2020-02-04 NOTE — Anesthesia Preprocedure Evaluation (Signed)
Anesthesia Evaluation  Patient identified by MRN, date of birth, ID band Patient awake    Reviewed: Allergy & Precautions, NPO status , Patient's Chart, lab work & pertinent test results  Airway Mallampati: III  TM Distance: >3 FB Neck ROM: Full    Dental no notable dental hx. (+) Teeth Intact, Dental Advisory Given   Pulmonary neg pulmonary ROS, Current Smoker and Patient abstained from smoking.,    Pulmonary exam normal breath sounds clear to auscultation       Cardiovascular negative cardio ROS Normal cardiovascular exam Rhythm:Regular Rate:Normal     Neuro/Psych Seizures -, Well Controlled,  PSYCHIATRIC DISORDERS Anxiety Depression Bipolar Disorder    GI/Hepatic negative GI ROS, (+) Hepatitis -, C  Endo/Other  Morbid obesity (BMI 40)  Renal/GU negative Renal ROS  negative genitourinary   Musculoskeletal negative musculoskeletal ROS (+)   Abdominal   Peds  Hematology negative hematology ROS (+) anemia ,   Anesthesia Other Findings   Reproductive/Obstetrics Patient desires sterilization post partum                             Anesthesia Physical  Anesthesia Plan  ASA: III  Anesthesia Plan: Epidural   Post-op Pain Management:    Induction:   PONV Risk Score and Plan: Treatment may vary due to age or medical condition  Airway Management Planned: Natural Airway  Additional Equipment:   Intra-op Plan:   Post-operative Plan:   Informed Consent: I have reviewed the patients History and Physical, chart, labs and discussed the procedure including the risks, benefits and alternatives for the proposed anesthesia with the patient or authorized representative who has indicated his/her understanding and acceptance.     Dental advisory given  Plan Discussed with: Anesthesiologist  Anesthesia Plan Comments: (Will use epidural for PPTL. Helmut Muster, MD)        Anesthesia Quick  Evaluation

## 2020-02-05 ENCOUNTER — Inpatient Hospital Stay (HOSPITAL_COMMUNITY): Payer: Medicaid Other

## 2020-02-05 ENCOUNTER — Inpatient Hospital Stay (HOSPITAL_COMMUNITY)
Admission: AD | Admit: 2020-02-05 | Payer: Medicaid Other | Source: Home / Self Care | Admitting: Obstetrics & Gynecology

## 2020-02-05 LAB — CBC
HCT: 29 % — ABNORMAL LOW (ref 36.0–46.0)
Hemoglobin: 9.6 g/dL — ABNORMAL LOW (ref 12.0–15.0)
MCH: 32.8 pg (ref 26.0–34.0)
MCHC: 33.1 g/dL (ref 30.0–36.0)
MCV: 99 fL (ref 80.0–100.0)
Platelets: 145 10*3/uL — ABNORMAL LOW (ref 150–400)
RBC: 2.93 MIL/uL — ABNORMAL LOW (ref 3.87–5.11)
RDW: 13.2 % (ref 11.5–15.5)
WBC: 12.7 10*3/uL — ABNORMAL HIGH (ref 4.0–10.5)
nRBC: 0 % (ref 0.0–0.2)

## 2020-02-05 MED ORDER — FERROUS SULFATE 325 (65 FE) MG PO TABS
325.0000 mg | ORAL_TABLET | ORAL | Status: DC
  Administered 2020-02-05: 325 mg via ORAL
  Filled 2020-02-05: qty 1

## 2020-02-05 NOTE — Progress Notes (Signed)
POSTPARTUM PROGRESS NOTE  Post Partum Day 1  Subjective:  Kelli Mullins is a 39 y.o. Y1O1751 s/p NSVD at [redacted]w[redacted]d  She reports she is doing well. No acute events overnight. She denies any problems with ambulating, voiding or po intake. Denies nausea or vomiting.  Pain is well controlled.  Lochia is appropriate.  Objective: Blood pressure 103/67, pulse 65, temperature 98.4 F (36.9 C), temperature source Oral, resp. rate 18, height '5\' 5"'$  (1.651 m), weight 107.5 kg, SpO2 100 %, unknown if currently breastfeeding.  Physical Exam:  General: alert, cooperative and no distress Chest: no respiratory distress Heart:regular rate, distal pulses intact Abdomen: soft, nontender,  Uterine Fundus: firm, appropriately tender DVT Evaluation: No calf swelling or tenderness Extremities: mild LE edema Skin: warm, dry  Recent Labs    02/03/20 2205 02/05/20 0421  HGB 10.5* 9.6*  HCT 32.0* 29.0*    Assessment/Plan: Kelli Ordoyneis a 40y.o. [id number]/p NSVD at 440w6d PPD#1 - Doing well  Routine postpartum care Rubella NI: offer MMR Rh neg: rhogam ordered, infant Rh+ Contraception: POD#1 BTL, incision site c/d/i Feeding: breast Dispo: Plan for discharge PPD#1-2 (pending baby, pt OK for discharge today if possible).   LOS: 2 days   MaAugustin CoupeMD/MPH OB Fellow  02/05/2020, 8:28 AM

## 2020-02-05 NOTE — Clinical Social Work Maternal (Addendum)
CLINICAL SOCIAL WORK MATERNAL/CHILD NOTE  Patient Details  Name: Kelli Mullins MRN: [id number] Date of Birth: 11/27/1980  Date:  06/12/20  Clinical Social Worker Initiating Note:  Georgeanne Nim, MCS, LCSW Date/Time: Initiated:  02/05/20/1145     Child's Name:  Kelli Mullins   Biological Parents:  Mother, Father (FOB, Kelli Mullins, 06/25/1961)   Need for Interpreter:  None   Reason for Referral:    [facility] @ 28 wk 1 day, Bipolar disorder, anxiety, depression, and substance abuse hx  Address:  [place] [address]    Phone number:  [phone] (home)     Additional phone number: denied additional  Household Members/Support Persons (HM/SP):   Household Member/Support Person 1, Household Member/Support Person 2   HM/SP Name Relationship DOB or Age  HM/SP -Chester FOB 06/25/1961  HM/SP -2 Eyvonne Mechanic son 01/02/1999  HM/SP -3        HM/SP -4        HM/SP -5        HM/SP -6        HM/SP -7        HM/SP -8          Natural Supports (not living in the home):      Professional Supports:     Employment: Unemployed   Type of Work:     Education:  Programmer, systems   Homebound arranged:    Museum/gallery curator Resources:  [employer]   Other Resources:  Theatre stage manager Considerations Which May Impact Care: none stated  Strengths:  Home prepared for child , Pediatrician chosen   Psychotropic Medications:         Pediatrician:    [employer] [facility]  Pediatrician List:   [place] (Dr. Elta Guadeloupe Busey)  [facility]      Pediatrician Fax Number:    Risk Factors/Current Problems:  Mental Health Concerns , DHHS Involvement , Substance Use    Cognitive State:  Able to Concentrate , Alert    Mood/Affect:  Flat , Calm    CSW Assessment: CSW received consult for late prenatal care and hx of bipolar disorder, anxiety, depression, and  substance abuse.  CSW met with MOB at bedside to offer support and complete assessment. On arrival , CSW introduced self and stated reason for visit. FOB and infant Kelli Mullins were present, however, after PPD/A and SIDS education, FOB stepped out of room to offer MOB privacy during assessment. MOB and FOB were flat in presentation, minimally engaged,  however pleasant.   CSW provided education regarding the baby blues period vs. perinatal mood disorders, discussed treatment and gave resources for mental health follow up if concerns arise.  CSW recommends self-evaluation during the postpartum time period using the New Mom Checklist from Postpartum Progress and encouraged MOB and FOB to contact a medical professional if symptoms are noted at any time. Both agreed and denied any questions.    CSW provided review of Sudden Infant Death Syndrome (SIDS) precautions. MOB and FOB stated understanding and denied any questions. MOB confirmed having all needed items for baby including car seat and crib for baby's safe sleep.    During assessment, MOB reported hx of Bipolar disorder, anxiety with panic attacks, and substance abuse. MOB identified sx as mood swings, irritably, and anxiety, and crying.  MOB explained sx were previously being treated  at [facility] with Dr. Lee, however, she has not been in a while. In addition, MOB reported previous Rx for Celexa and Risperidone of which she is no longer taking. MOB reported managing sx with coping skills such as walking, painting, and taking time to herself. MOB stated she has not painted in awhile. CSW encouraged MOB to get back to painting if it was helpful.  MOB reported FOB and son Omar as support. MOB denied any SI, HI, or domestic violence.   CSW informed MOB of hospital's late prenatal care infant's drug screen policy, infant's negative UDS, pending CDS, and CPS report if warranted. MOB stated understanding and denied any questions. CSW assessed for substance use  concerns and CPS hx. MOB reported hx of cocaine abuse and stated she has not used since June or July 2020. CSW celebrated MOB's sobriety and offered treatment resources, however, MOB declined. MOB reported [place] CPS involvement with older children. MOB reported losing custody of children in 2014 due to neglect and substance abuse. CSW thanked MOB for honesty. MOB stated concerns with being allowed to keep this baby due to CPS hx. CSW encouraged MOB to remain positive and informed MOB of plan to contact CPS to confirm safe discharge. MOB stated understanding and denied any questions. CSW agreed to update MOB once details are provided. MOB stated appreciation.   CSW received a report from RN, Abby regarding questionable urine sample and parental behaviors. CSW informed [place] CPS of concerns at time of safe discharge confirmation. CPS report was completed with CPS Intake Worker, Darian Walker. CSW is awaiting callback and disposition.   CSW Plan/Description:  Sudden Infant Death Syndrome (SIDS) Education, Perinatal Mood and Anxiety Disorder (PMADs) Education, Hospital Drug Screen Policy Information, CSW Awaiting CPS Disposition Plan, CSW Will Continue to Monitor Umbilical Cord Tissue Drug Screen Results and Make Report if Warranted    Kento Gossman D. Babak Lucus, MSW, LCSW Clinical Social Worker [phone] 02/05/2020, 1:42 PM 

## 2020-02-05 NOTE — Lactation Note (Signed)
This note was copied from a baby's chart. Lactation Consultation Note  Patient Name: Boy Marguetta Windish UEAVW'U Date: 02/05/2020    P6 mother whose feeding preference is listed as breast/bottle.  However, upon chart review, mother has formula fed only.  Consulted RN to determine if an LC visit is needed.  Per RN, mother does not wish to have a consult at this time.   Maternal Data    Feeding    LATCH Score                   Interventions    Lactation Tools Discussed/Used     Consult Status      Diyan Dave R Cheralyn Oliver 02/05/2020, 9:54 AM

## 2020-02-05 NOTE — Progress Notes (Signed)
CSW received a call from [facility] CPS Intake Worker, Jacques Navy stating CPS investigator Velva Harman will be out to assess MOB and infant in the morning before discharge.   At this time, there are barriers to infant discharging with MOB.   Craigory Toste D. Dortha Kern, MSW, LCSW Clinical Social Worker [phone]

## 2020-02-06 LAB — RH IG WORKUP (INCLUDES ABO/RH)
ABO/RH(D): O NEG
Fetal Screen: NEGATIVE
Gestational Age(Wks): 40.6
Unit division: 0

## 2020-02-06 MED ORDER — ACETAMINOPHEN 325 MG PO TABS: 650.0000 mg | ORAL_TABLET | ORAL | 0 refills | Status: AC | PRN

## 2020-02-06 MED ORDER — POLYETHYLENE GLYCOL 3350 17 GM/SCOOP PO POWD: 17.0000 g | Freq: Every day | ORAL | 1 refills | Status: AC | PRN

## 2020-02-06 MED ORDER — OXYCODONE HCL 5 MG PO TABS: 5.0000 mg | ORAL_TABLET | ORAL | 0 refills | Status: DC | PRN

## 2020-02-06 MED ORDER — FERROUS SULFATE 325 (65 FE) MG PO TABS: 325.0000 mg | ORAL_TABLET | ORAL | 1 refills | Status: DC

## 2020-02-06 MED ORDER — IBUPROFEN 600 MG PO TABS: 600.0000 mg | ORAL_TABLET | Freq: Four times a day (QID) | ORAL | 0 refills | Status: AC

## 2020-02-06 NOTE — Progress Notes (Addendum)
10:59am-CSW received call back from R. Cheek informing CSW that he wold be here to speak with MOB around `11:45, R. Cheek to call CSW once arrived to the hospital.   CSW followed up with R. Cheek with [facility] CPS. CSW left voicemail for call back to gather updates on MOB and infant.     Claude Manges Vinny Taranto, MSW, LCSW [facility] [place] [phone]

## 2020-02-06 NOTE — Progress Notes (Signed)
csw updated that per R.Cheek with [place] CPS,  there are no barriers to infant discharging home with mob.   Dario Yono S. Neeko Pharo, MSW, LCSW [facility] [place] [phone]  

## 2020-02-06 NOTE — Discharge Instructions (Signed)
Postpartum Care After Vaginal Delivery This sheet gives you information about how to care for yourself from the time you deliver your baby to up to 6-12 weeks after delivery (postpartum period). Your health care provider may also give you more specific instructions. If you have problems or questions, contact your health care provider. Follow these instructions at home: Vaginal bleeding  It is normal to have vaginal bleeding (lochia) after delivery. Wear a sanitary pad for vaginal bleeding and discharge. ? During the first week after delivery, the amount and appearance of lochia is often similar to a menstrual period. ? Over the next few weeks, it will gradually decrease to a dry, yellow-brown discharge. ? For most women, lochia stops completely by 4-6 weeks after delivery. Vaginal bleeding can vary from woman to woman.  Change your sanitary pads frequently. Watch for any changes in your flow, such as: ? A sudden increase in volume. ? A change in color. ? Large blood clots.  If you pass a blood clot from your vagina, save it and call your health care provider to discuss. Do not flush blood clots down the toilet before talking with your health care provider.  Do not use tampons or douches until your health care provider says this is safe.  If you are not breastfeeding, your period should return 6-8 weeks after delivery. If you are feeding your child breast milk only (exclusive breastfeeding), your period may not return until you stop breastfeeding. Perineal care  Keep the area between the vagina and the anus (perineum) clean and dry as told by your health care provider. Use medicated pads and pain-relieving sprays and creams as directed.  If you had a cut in the perineum (episiotomy) or a tear in the vagina, check the area for signs of infection until you are healed. Check for: ? More redness, swelling, or pain. ? Fluid or blood coming from the cut or tear. ? Warmth. ? Pus or a bad  smell.  You may be given a squirt bottle to use instead of wiping to clean the perineum area after you go to the bathroom. As you start healing, you may use the squirt bottle before wiping yourself. Make sure to wipe gently.  To relieve pain caused by an episiotomy, a tear in the vagina, or swollen veins in the anus (hemorrhoids), try taking a warm sitz bath 2-3 times a day. A sitz bath is a warm water bath that is taken while you are sitting down. The water should only come up to your hips and should cover your buttocks. Breast care  Within the first few days after delivery, your breasts may feel heavy, full, and uncomfortable (breast engorgement). Milk may also leak from your breasts. Your health care provider can suggest ways to help relieve the discomfort. Breast engorgement should go away within a few days.  If you are breastfeeding: ? Wear a bra that supports your breasts and fits you well. ? Keep your nipples clean and dry. Apply creams and ointments as told by your health care provider. ? You may need to use breast pads to absorb milk that leaks from your breasts. ? You may have uterine contractions every time you breastfeed for up to several weeks after delivery. Uterine contractions help your uterus return to its normal size. ? If you have any problems with breastfeeding, work with your health care provider or lactation consultant.  If you are not breastfeeding: ? Avoid touching your breasts a lot. Doing this can make   your breasts produce more milk. ? Wear a good-fitting bra and use cold packs to help with swelling. ? Do not squeeze out (express) milk. This causes you to make more milk. Intimacy and sexuality  Ask your health care provider when you can engage in sexual activity. This may depend on: ? Your risk of infection. ? How fast you are healing. ? Your comfort and desire to engage in sexual activity.  You are able to get pregnant after delivery, even if you have not had  your period. If desired, talk with your health care provider about methods of birth control (contraception). Medicines  Take over-the-counter and prescription medicines only as told by your health care provider.  If you were prescribed an antibiotic medicine, take it as told by your health care provider. Do not stop taking the antibiotic even if you start to feel better. Activity  Gradually return to your normal activities as told by your health care provider. Ask your health care provider what activities are safe for you.  Rest as much as possible. Try to rest or take a nap while your baby is sleeping. Eating and drinking   Drink enough fluid to keep your urine pale yellow.  Eat high-fiber foods every day. These may help prevent or relieve constipation. High-fiber foods include: ? Whole grain cereals and breads. ? Brown rice. ? Beans. ? Fresh fruits and vegetables.  Do not try to lose weight quickly by cutting back on calories.  Take your prenatal vitamins until your postpartum checkup or until your health care provider tells you it is okay to stop. Lifestyle  Do not use any products that contain nicotine or tobacco, such as cigarettes and e-cigarettes. If you need help quitting, ask your health care provider.  Do not drink alcohol, especially if you are breastfeeding. General instructions  Keep all follow-up visits for you and your baby as told by your health care provider. Most women visit their health care provider for a postpartum checkup within the first 3-6 weeks after delivery. Contact a health care provider if:  You feel unable to cope with the changes that your child brings to your life, and these feelings do not go away.  You feel unusually sad or worried.  Your breasts become red, painful, or hard.  You have a fever.  You have trouble holding urine or keeping urine from leaking.  You have little or no interest in activities you used to enjoy.  You have not  breastfed at all and you have not had a menstrual period for 12 weeks after delivery.  You have stopped breastfeeding and you have not had a menstrual period for 12 weeks after you stopped breastfeeding.  You have questions about caring for yourself or your baby.  You pass a blood clot from your vagina. Get help right away if:  You have chest pain.  You have difficulty breathing.  You have sudden, severe leg pain.  You have severe pain or cramping in your lower abdomen.  You bleed from your vagina so much that you fill more than one sanitary pad in one hour. Bleeding should not be heavier than your heaviest period.  You develop a severe headache.  You faint.  You have blurred vision or spots in your vision.  You have bad-smelling vaginal discharge.  You have thoughts about hurting yourself or your baby. If you ever feel like you may hurt yourself or others, or have thoughts about taking your own life, get help  right away. You can go to the nearest emergency department or call:  Your local emergency services (911 in the [place]  A suicide crisis helpline, such as the National Suicide Prevention Lifeline at 1-800-273-8255. This is open 24 hours a day. Summary  The period of time right after you deliver your newborn up to 6-12 weeks after delivery is called the postpartum period.  Gradually return to your normal activities as told by your health care provider.  Keep all follow-up visits for you and your baby as told by your health care provider. This information is not intended to replace advice given to you by your health care provider. Make sure you discuss any questions you have with your health care provider. Document Revised: 07/10/2017 Document Reviewed: 04/20/2017 Elsevier Patient Education  2020 Elsevier Inc.    Postpartum Tubal Ligation, Care After This sheet gives you information about how to care for yourself after your procedure. Your health care provider may  also give you more specific instructions. If you have problems or questions, contact your health care provider. What can I expect after the procedure? After the procedure, you may have:  A sore throat.  Bruising or pain in your back.  Nausea or vomiting.  Dizziness.  Mild abdominal discomfort or pain, such as cramping, gas pain, or feeling bloated.  Soreness around the incision area.  Tiredness.  Pain in your shoulders. Follow these instructions at home: Medicines  Ask your health care provider if the medicine prescribed to you: ? Requires you to avoid driving or using heavy machinery. ? Can cause constipation. You may need to take actions to prevent or treat constipation, such as:  Drink enough fluid to keep your urine pale yellow.  Take over-the-counter or prescription medicines.  Eat foods that are high in fiber, such as beans, whole grains, and fresh fruits and vegetables.  Limit foods that are high in fat and processed sugars, such as fried or sweet foods.  Do not take aspirin because it can cause bleeding. Activity  Rest for the remainder of the day.  Return to your normal activities as told by your health care provider. Ask your health care provider what activities are safe for you.  Do not have sex, douche, or put a tampon or anything else in your vagina for 6 weeks or as long as told by your health care provider.  Do not lift anything that is heavier than your baby for 2 weeks, or the limit that you are told, until your health care provider says that it is safe. Incision care      Follow instructions from your health care provider about how to take care of your incision. Make sure you: ? Wash your hands with soap and water before and after you change your bandage (dressing). If soap and water are not available, use hand sanitizer. ? Change your dressing as told by your health care provider. ? Leave stitches (sutures), skin glue, or adhesive strips in  place. These skin closures may need to stay in place for 2 weeks or longer. If adhesive strip edges start to loosen and curl up, you may trim the loose edges. Do not remove adhesive strips completely unless your health care provider tells you to do that.  Check your incision area every day for signs of infection. Check for: ? Redness, swelling, or pain. ? Fluid or blood. ? Warmth. ? Pus or a bad smell. Other Instructions  Do not take baths, swim, or use a   hot tub until your health care provider approves. Ask your health care provider if you may take showers. You may only be allowed to take sponge baths.  Keep all follow-up visits as told by your health care provider. This is important. Contact a health care provider if:  You have redness, swelling, or pain around your incision.  Your incision feels warm to the touch.  Your pain does not improve after 2-3 days.  You have a rash.  You repeatedly become dizzy or lightheaded.  Your pain medicine is not helping.  You are constipated. Get help right away if you:  Have a fever.  Faint.  Have pain in your abdomen that gets worse.  Have fluid or blood coming from your incision.  You have pus or a bad smell coming from your incision.  The edges of your incision break open after the sutures have been removed.  Have shortness of breath or trouble breathing.  Have chest pain or leg pain.  Have ongoing nausea or diarrhea. Summary  Mild abdominal discomfort is common after this procedure.  Contact your health care provider if you experience problems or have concerns.  Do not lift anything that is heavier than your baby for 2 weeks, or the limit that you are told, until your health care provider says that it is safe.  Keep all follow-up visits as told by your health care provider. This is important. This information is not intended to replace advice given to you by your health care provider. Make sure you discuss any questions  you have with your health care provider. Document Revised: 12/20/2018 Document Reviewed: 05/27/2018 Elsevier Patient Education  2020 Elsevier Inc.  

## 2020-02-07 ENCOUNTER — Encounter: Payer: Self-pay | Admitting: *Deleted

## 2020-02-07 LAB — SURGICAL PATHOLOGY

## 2020-02-08 ENCOUNTER — Telehealth: Payer: Self-pay | Admitting: Obstetrics & Gynecology

## 2020-02-08 NOTE — Telephone Encounter (Signed)
Patient has sore throat, and the right side of her face hurts. Would like to know if something can be sent into pharmacy or does she need appointment for symptoms.

## 2020-02-08 NOTE — Telephone Encounter (Signed)
Patient returned phone call. States is having sore throat and no fever. Advised patient could use Tylenol, Sudafed, or Zicam. If symptoms don't improve may want to schedule with primary care physician. Patient voiced understanding.

## 2020-02-08 NOTE — Telephone Encounter (Signed)
Telephoned patient at home number and left message to return call.  

## 2020-03-13 ENCOUNTER — Ambulatory Visit: Payer: Medicaid Other | Admitting: Obstetrics & Gynecology

## 2020-03-23 ENCOUNTER — Ambulatory Visit: Payer: Medicaid Other | Admitting: Women's Health

## 2020-04-04 ENCOUNTER — Encounter: Payer: Self-pay | Admitting: Women's Health

## 2020-04-04 ENCOUNTER — Ambulatory Visit (INDEPENDENT_AMBULATORY_CARE_PROVIDER_SITE_OTHER): Payer: Medicaid Other | Admitting: Women's Health

## 2020-04-04 DIAGNOSIS — B1920 Unspecified viral hepatitis C without hepatic coma: Secondary | ICD-10-CM

## 2020-04-04 NOTE — Progress Notes (Signed)
POSTPARTUM VISIT Patient name: Kelli Mullins MRN [id number]  Date of birth: [birth year] Chief Complaint:   Postpartum Care (discuss possible flare up of Hep C)  History of Present Illness:   Kelli Mullins is a 39 y.o. [phone] Caucasian female being seen today for a postpartum visit. She is 8 weeks postpartum following a spontaneous vaginal delivery at 40.6 gestational weeks. IOL: No, for n/a. Anesthesia: epidural.  Laceration: hemostatic perineal, no repair.  Complications: none. Inpatient contraception: yes BTL.   Tobacco use: yes 1/2ppd. Substance use disorder: no. 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   Pregnancy complicated by late care, new HepC dx.  Last pap smear: 11/07/19 and results were abnormal  ASC-H w/ -HRHPV, needs colpo 12wks pp per [facility]  Postpartum course has been uncomplicated. Does feel like she may have a 'HepC flare', body aches, low back pain, chills x 2wks. No breast redness. Had neg Covid test 1wk ago. Bleeding no bleeding. Bowel function is normal. Bladder function is normal. Urinary incontinence? No, fecal incontinence? No Patient is not sexually active. Last sexual activity: prior to birth of baby.  Desired contraception: BTL done PP. Patient does not want a pregnancy in the future.  Desired family size is 6 children.    Patient's last menstrual period was 03/25/2020 (approximate).  Baby's course has been uncomplicated. Baby is feeding by bottle.  Infant has a pediatrician/family doctor? Yes.   Pt has material needs met for her and baby: Yes.     Upstream - 04/04/20 1358      Pregnancy Intention Screening   Does the patient want to become pregnant in the next year? No    Does the patient's partner want to become pregnant in the next year? No    Would the patient like to discuss contraceptive options today? No      Contraception Wrap Up   Current Method Female Sterilization    End Method Female Sterilization    Contraception Counseling Provided No            The pregnancy intention screening data noted above was reviewed. Potential methods of contraception were discussed. The patient elected to proceed with Female Sterilization.   Edinburgh Postpartum Depression Screening: negative. Does have h/o dep/anx, was on anxiety meds prior to pregnancy, quit w/ +PT. Doing well. Sees [facility] q 4wks.   Edinburgh Postnatal Depression Scale - 04/04/20 1358      Edinburgh Postnatal Depression Scale:  In the Past 7 Days   I have been able to laugh and see the funny side of things. 0    I have looked forward with enjoyment to things. 1    I have blamed myself unnecessarily when things went wrong. 1    I have been anxious or worried for no good reason. 1    I have felt scared or panicky for no good reason. 2    Things have been getting on top of me. 1    I have been so unhappy that I have had difficulty sleeping. 0    I have felt sad or miserable. 1    I have been so unhappy that I have been crying. 1    The thought of harming myself has occurred to me. 0    Edinburgh Postnatal Depression Scale Total 8          Review of Systems:   Pertinent items are noted in HPI Denies Abnormal vaginal discharge w/ itching/odor/irritation, headaches, visual changes, shortness of  breath, chest pain, abdominal pain, severe nausea/vomiting, or problems with urination or bowel movements. Pertinent History Reviewed:  Reviewed past medical,surgical, obstetrical and family history.  Reviewed problem list, medications and allergies. OB History  Gravida Para Term Preterm AB Living  _0 SAB TAB Ectopic Multiple Live Births  1     0 6    # Outcome Date GA Lbr Len/2nd Weight Sex Delivery Anes PTL Lv  7 Term 02/04/20 4w6d28:52 / 00:32 9 lb 5.7 oz (4.245 kg) M Vag-Spont EPI  LIV  6 SAB 2020          5 Term 06/06/10 470w0d9 lb 2 oz (4.139 kg) M Vag-Spont None N LIV  4 Term 05/27/06 3968w0d lb 2 oz (3.232 kg) M Vag-Spont EPI N LIV  3 Term 05/17/04 40w74w0dlb  (3.629 kg) F Vag-Spont EPI N LIV  2 Term 09/29/00 40w054w0db 11 oz (3.941 kg) F Vag-Spont EPI  LIV  1 Term 01/02/99 35w0d77w0d 3 oz (3.26 kg) M Vag-Spont EPI N LIV   Physical Assessment:   Vitals:   04/04/20 1354  BP: 114/77  Pulse: 72  Weight: 222 lb 12.8 oz (101.1 kg)  Height: _1  (1.651 m)  Body mass index is 37.08 kg/m.       Physical Examination:   General appearance: alert, well appearing, and in no distress  Mental status: alert, oriented to person, place, and time  Skin: warm & dry   Cardiovascular: normal heart rate noted   Respiratory: normal respiratory effort, no distress   Breasts: deferred, no complaints   Abdomen: soft, non-tender, BTL incision well-healed   Pelvic: examination not indicated  Rectal: not examined  Extremities: no edema       No results found for this or any previous visit (from the past 24 hour(s)).  Assessment & Plan:  1) Postpartum exam 2) 8 wks s/p spontaneous vaginal delivery 3) bottle feeding 4) Depression screening 5) S/P BTL 6) HepC> dx in pregnancy, referral to GI ordered today 7) Abnormal pap> needs colpo at 12wks pp, scheduled today  Essential components of care per ACOG recommendations:  1.  Mood and well being:  . If positive depression screen, discussed and plan developed.  . If using tobacco we discussed reduction/cessation and risk of relapse  2. Infant care and feeding:  . If breastfeeding discussed returning to work, pumping, breastfeeding-associated pain, guidance regarding return to fertility while lactating if not using another method. If needed, patient was provided with a letter to be allowed to pump q 2-3hrs to support lactation in a private location with access to a refrigerator to store breastmilk.   . Recommended that all caregivers be immunized for flu, pertussis and other preventable communicable diseases . If pt does not have material needs met for her/baby, referred to local resources for help obtaining  these.  3. Sexuality, contraception and birth spacing . Provided guidance regarding sexuality, management of dyspareunia, and resumption of intercourse . Discussed avoiding interpregnancy interval <6mths 74m recommended birth spacing of 18 months  4. Sleep and fatigue . Discussed coping options for fatigue and sleep disruption . Encouraged family/partner/community support of 4 hrs of uninterrupted sleep to help with mood and fatigue  5. Physical recovery  . If pt had a C/S, assessed incisional pain and providing guidance on normal vs prolonged recovery . If pt had a laceration, perineal healing and pain  reviewed.  . If urinary or fecal incontinence, discussed management and referred to PT or uro/gyn if indicated  . Patient is safe to resume physical activity. Discussed attainment of healthy weight.  6.  Chronic disease management . Discussed pregnancy complications if any, and their implications for future childbearing and long-term maternal health. . Review recommendations for prevention of recurrent pregnancy complications, such as 17 hydroxyprogesterone caproate to reduce risk for recurrent PTB not applicable, or aspirin to reduce risk of preeclampsia not applicable. . Pt had GDM: No. If yes, 2hr GTT scheduled: not applicable. . Reviewed medications and non-pregnant dosing including consideration of whether pt is breastfeeding using a reliable resource such as LactMed: not applicable . Referred for f/u w/ PCP or subspecialist providers as indicated: yes  7. Health maintenance . Mammogram at 37yo or earlier if indicated . Pap smears as indicated  Meds: No orders of the defined types were placed in this encounter.   Follow-up: Return in about 4 weeks (around 05/02/2020) for colpo w/ MD.   Orders Placed This Encounter  Procedures  . Ambulatory referral to Gastroenterology    [place], [facility] 04/04/2020 2:32 PM

## 2020-04-12 ENCOUNTER — Encounter: Payer: Self-pay | Admitting: Internal Medicine

## 2020-05-03 ENCOUNTER — Other Ambulatory Visit: Payer: Self-pay

## 2020-05-03 ENCOUNTER — Encounter: Payer: Self-pay | Admitting: Obstetrics & Gynecology

## 2020-05-03 ENCOUNTER — Ambulatory Visit (INDEPENDENT_AMBULATORY_CARE_PROVIDER_SITE_OTHER): Payer: Medicaid Other | Admitting: Obstetrics & Gynecology

## 2020-05-03 VITALS — BP 127/81 | HR 70

## 2020-05-03 DIAGNOSIS — R87611 Atypical squamous cells cannot exclude high grade squamous intraepithelial lesion on cytologic smear of cervix (ASC-H): Secondary | ICD-10-CM

## 2020-05-03 NOTE — Progress Notes (Signed)
Colposcopy Procedure Note:  Colposcopy Procedure Note  Indications:  3/21  ASC-H   2019 ASCCP recommendation:  Smoker:  Yes.  1/2 ppd New sexual partner:  No.  : time frame:  No.  History of abnormal Pap: yes  Procedure Details  The risks and benefits of the procedure and Written informed consent obtained.  Speculum placed in vagina and excellent visualization of cervix achieved, cervix swabbed x 3 with acetic acid solution.  Findings: Cervix: no visible lesions, no mosaicism, no punctation and no abnormal vasculature; SCJ visualized 360 degrees without lesions and no biopsies taken. Vaginal inspection: normal without visible lesions. Vulvar colposcopy: vulvar colposcopy not performed.  Specimens: none  Complications: none.  Plan(Based on 2019 ASCCP recommendations) Repeat HPV based cytology 4/22

## 2020-05-24 NOTE — Progress Notes (Deleted)
Referring Provider:*** Primary Care Physician:  Scotty Court, DO Primary Gastroenterologist:  Dr. Rayne Du chief complaint on file.   HPI:   Kelli Mullins is a 39 y.o. female presenting today at the request of Roma Schanz, CNM for hepatitis C.  She had positive hep C antibody 11/07/2019.  She was pregnant at the time of testing.  Recommended GI referral for treatment after delivery.  In April, LFTs were within normal limits.  She had spontaneous vaginal delivery on 02/03/2020.  At that time, AST and ALT remain normal.  Alk phos slightly elevated at 131, total bilirubin normal.    Past Medical History:  Diagnosis Date  . Anemia   . Bipolar 1 disorder ([place])   . Depression   . History of kidney stones   . Panic attacks   . Seizure ([place]) 2019   Unknown etiology; on no meds and has had no more seizures    Past Surgical History:  Procedure Laterality Date  . DILATION AND CURETTAGE OF UTERUS N/A 10/08/2018   Procedure: CERVICAL DILATATION WITH SUCTION  AND SHARP UTERINE CURETTAGE;  Surgeon: Florian Buff, MD;  Location: [facility];  Service: Gynecology;  Laterality: N/A;  . kidney stones    . TUBAL LIGATION Bilateral 02/04/2020   Procedure: POST PARTUM TUBAL LIGATION;  Surgeon: Osborne Oman, MD;  Location: [facility] LD ORS;  Service: Gynecology;  Laterality: Bilateral;    Current Outpatient Medications  Medication Sig Dispense Refill  . acetaminophen (TYLENOL) 325 MG tablet Take 2 tablets (650 mg total) by mouth every 4 (four) hours as needed (for pain scale < 4). (Patient not taking: Reported on 05/03/2020) 30 tablet 0  . Blood Pressure Monitor MISC For regular home bp monitoring during pregnancy (Patient not taking: Reported on 05/03/2020) 1 each 0  . ibuprofen (ADVIL) 600 MG tablet Take 1 tablet (600 mg total) by mouth every 6 (six) hours. (Patient not taking: Reported on 05/03/2020) 30 tablet 0  . polyethylene glycol powder (GLYCOLAX/MIRALAX) 17 GM/SCOOP powder Take 17 g by  mouth daily as needed. (Patient not taking: Reported on 05/03/2020) 510 g 1   No current facility-administered medications for this visit.    Allergies as of 05/25/2020  . (No Known Allergies)    Family History  Problem Relation Age of Onset  . Diabetes Maternal Grandmother   . Cirrhosis Maternal Grandfather   . Cancer Father        prostate  . Drug abuse Mother        overdose  . Autism Son     Social History   Socioeconomic History  . Marital status: Married    Spouse name: Not on file  . Number of children: Not on file  . Years of education: Not on file  . Highest education level: Not on file  Occupational History  . Not on file  Tobacco Use  . Smoking status: Current Every Day Smoker    Packs/day: 0.50    Years: 5.00    Pack years: 2.50    Types: Cigarettes  . Smokeless tobacco: Never Used  Vaping Use  . Vaping Use: Never used  Substance and Sexual Activity  . Alcohol use: No  . Drug use: Not Currently    Types: Cocaine    Comment: last used 02/2019 per pt  . Sexual activity: Yes    Birth control/protection: Surgical    Comment: tubal  Other Topics Concern  . Not on file  Social History  Narrative  . Not on file   Social Determinants of Health   Financial Resource Strain: Low Risk   . Difficulty of Paying Living Expenses: Not hard at all  Food Insecurity: No Food Insecurity  . Worried About Charity fundraiser in the Last Year: Never true  . Ran Out of Food in the Last Year: Never true  Transportation Needs: No Transportation Needs  . Lack of Transportation (Medical): No  . Lack of Transportation (Non-Medical): No  Physical Activity: Insufficiently Active  . Days of Exercise per Week: 2 days  . Minutes of Exercise per Session: 20 min  Stress: No Stress Concern Present  . Feeling of Stress : Not at all  Social Connections: Socially Isolated  . Frequency of Communication with Friends and Family: Once a week  . Frequency of Social Gatherings with  Friends and Family: Once a week  . Attends Religious Services: 1 to 4 times per year  . Active Member of Clubs or Organizations: No  . Attends Archivist Meetings: Never  . Marital Status: Divorced  Human resources officer Violence: Not At Risk  . Fear of Current or Ex-Partner: No  . Emotionally Abused: No  . Physically Abused: No  . Sexually Abused: No    Review of Systems: Gen: Denies any fever, chills, fatigue, weight loss, lack of appetite.  CV: Denies chest pain, heart palpitations, peripheral edema, syncope.  Resp: Denies shortness of breath at rest or with exertion. Denies wheezing or cough.  GI: Denies dysphagia or odynophagia. Denies jaundice, hematemesis, fecal incontinence. GU : Denies urinary burning, urinary frequency, urinary hesitancy MS: Denies joint pain, muscle weakness, cramps, or limitation of movement.  Derm: Denies rash, itching, dry skin Psych: Denies depression, anxiety, memory loss, and confusion Heme: Denies bruising, bleeding, and enlarged lymph nodes.  Physical Exam: There were no vitals taken for this visit. General:   Alert and oriented. Pleasant and cooperative. Well-nourished and well-developed.  Head:  Normocephalic and atraumatic. Eyes:  Without icterus, sclera clear and conjunctiva pink.  Ears:  Normal auditory acuity. Nose:  No deformity, discharge,  or lesions. Mouth:  No deformity or lesions, oral mucosa pink.  Neck:  Supple, without mass or thyromegaly. Lungs:  Clear to auscultation bilaterally. No wheezes, rales, or rhonchi. No distress.  Heart:  S1, S2 present without murmurs appreciated.  Abdomen:  +BS, soft, non-tender and non-distended. No HSM noted. No guarding or rebound. No masses appreciated.  Rectal:  Deferred  Msk:  Symmetrical without gross deformities. Normal posture. Pulses:  Normal pulses noted. Extremities:  Without clubbing or edema. Neurologic:  Alert and  oriented x4;  grossly normal neurologically. Skin:  Intact  without significant lesions or rashes. Cervical Nodes:  No significant cervical adenopathy. Psych:  Alert and cooperative. Normal mood and affect.

## 2020-05-25 ENCOUNTER — Ambulatory Visit: Payer: Medicaid Other | Admitting: Gastroenterology

## 2020-06-27 ENCOUNTER — Encounter: Payer: Self-pay | Admitting: General Practice
